# Patient Record
Sex: Female | Born: 1985 | Race: White | Hispanic: No | Marital: Married | Smoking: Never smoker
Health system: Southern US, Community
[De-identification: ages and names within clinical notes are randomized; demographics above are authoritative.]

## PROBLEM LIST (undated history)

## (undated) DIAGNOSIS — R519 Headache, unspecified: Secondary | ICD-10-CM

## (undated) DIAGNOSIS — Z9889 Other specified postprocedural states: Secondary | ICD-10-CM

## (undated) DIAGNOSIS — R112 Nausea with vomiting, unspecified: Secondary | ICD-10-CM

## (undated) DIAGNOSIS — R51 Headache: Secondary | ICD-10-CM

## (undated) DIAGNOSIS — O24419 Gestational diabetes mellitus in pregnancy, unspecified control: Secondary | ICD-10-CM

## (undated) HISTORY — PX: ARTHROSCOPIC REPAIR ACL: SUR80

## (undated) HISTORY — DX: Other specified postprocedural states: R11.2

## (undated) HISTORY — DX: Headache, unspecified: R51.9

## (undated) HISTORY — DX: Gestational diabetes mellitus in pregnancy, unspecified control: O24.419

## (undated) HISTORY — DX: Nausea with vomiting, unspecified: R11.2

## (undated) HISTORY — DX: Other specified postprocedural states: Z98.890

## (undated) HISTORY — DX: Headache: R51

## (undated) HISTORY — PX: TUMOR REMOVAL: SHX12

---

## 2007-04-26 ENCOUNTER — Emergency Department (HOSPITAL_COMMUNITY): Admission: EM | Admit: 2007-04-26 | Discharge: 2007-04-26 | Payer: Self-pay | Admitting: Emergency Medicine

## 2007-11-26 ENCOUNTER — Emergency Department (HOSPITAL_COMMUNITY): Admission: EM | Admit: 2007-11-26 | Discharge: 2007-11-26 | Payer: Self-pay | Admitting: Emergency Medicine

## 2009-04-26 IMAGING — CT CT HEAD W/O CM
1 series · 16 of 30 positions shown, 20 images · non-contrast
Comparison: None

CLINICAL DATA: Alcohol poisoning and vomiting

CT HEAD WITHOUT CONTRAST
TECHNIQUE: Contiguous axial images were obtained from the base of
the skull through the vertex without contrast.

[Series 2: headseq 4.8 h45s · axial · 0.43mm/px · z∈[-148,-20]mm · 16 of 30 slices shown, 20 images]
[im 2/30  brain]
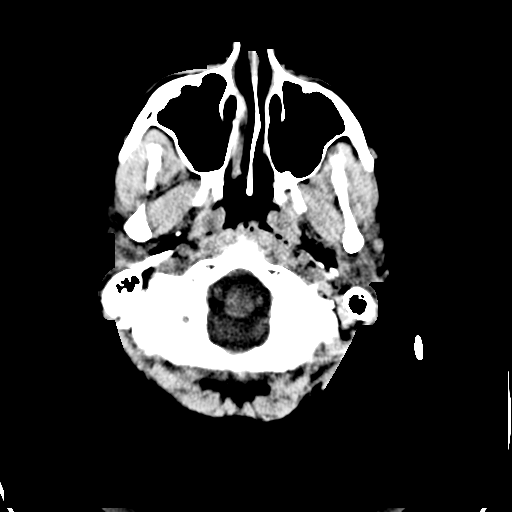
[im 2/30  bone]
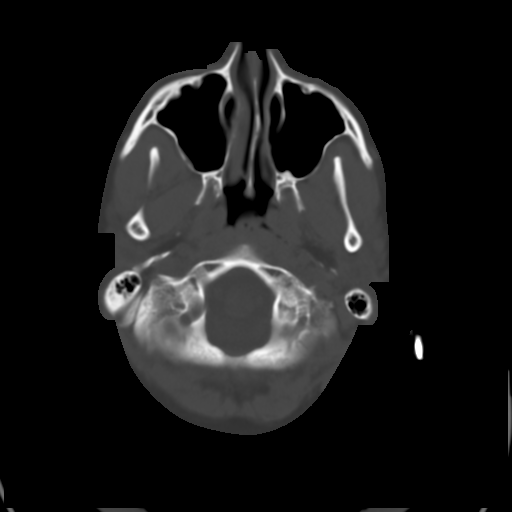
[im 4/30  brain]
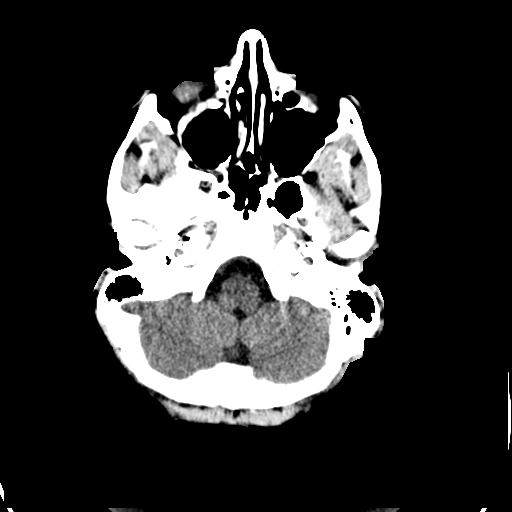
[im 6/30  brain]
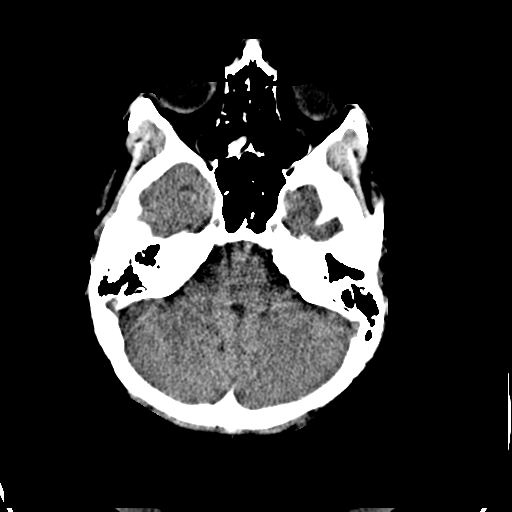
[im 8/30  brain]
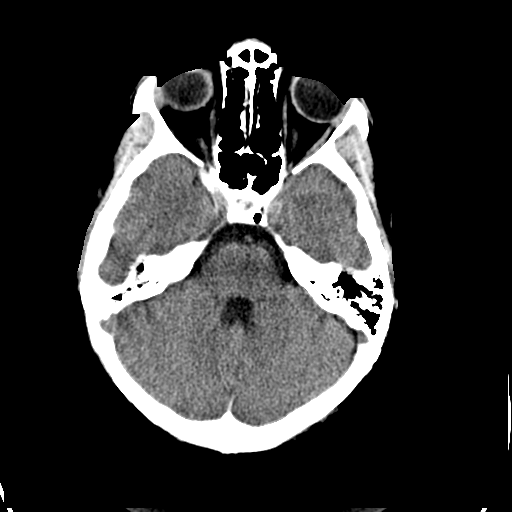
[im 9/30  brain]
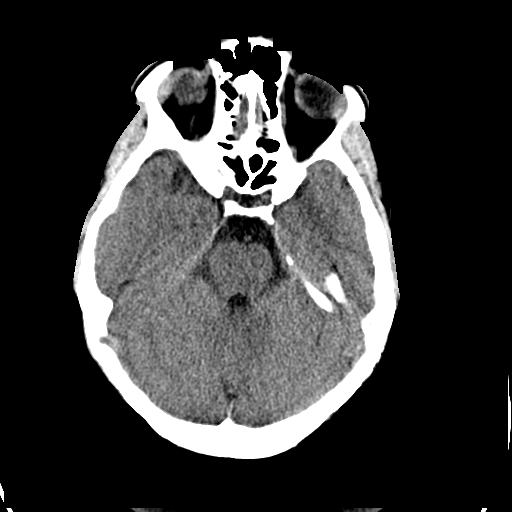
[im 9/30  bone]
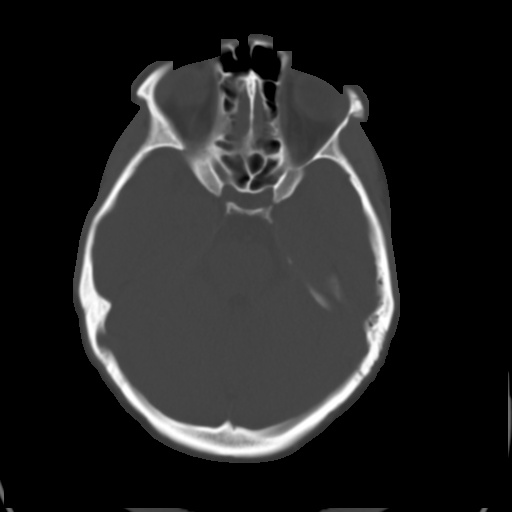
[im 11/30  brain]
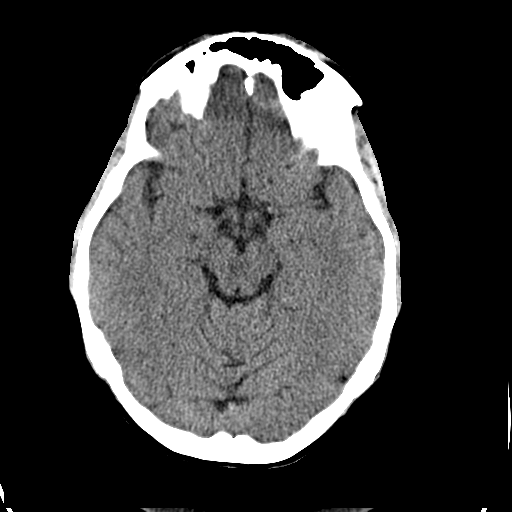
[im 13/30  brain]
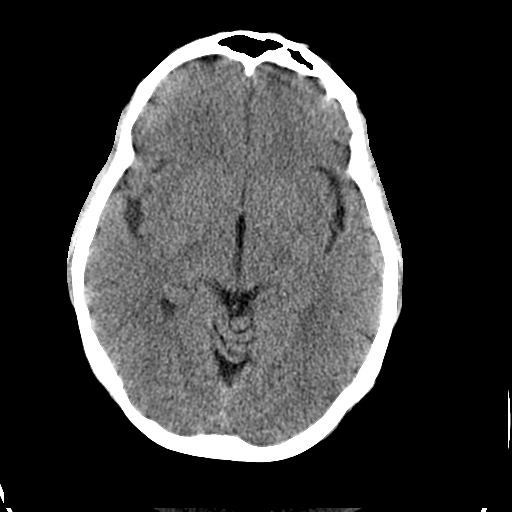
[im 15/30  brain]
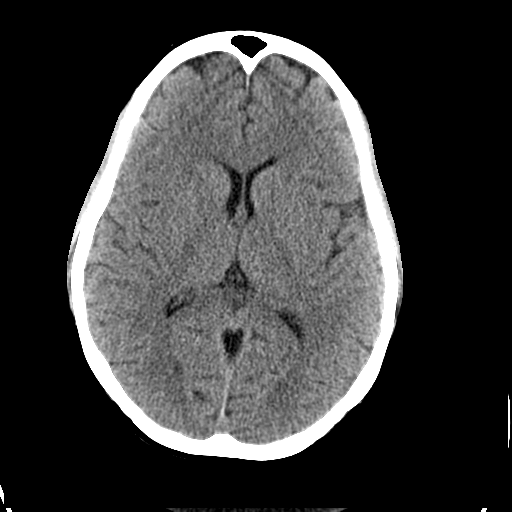
[im 16/30  brain]
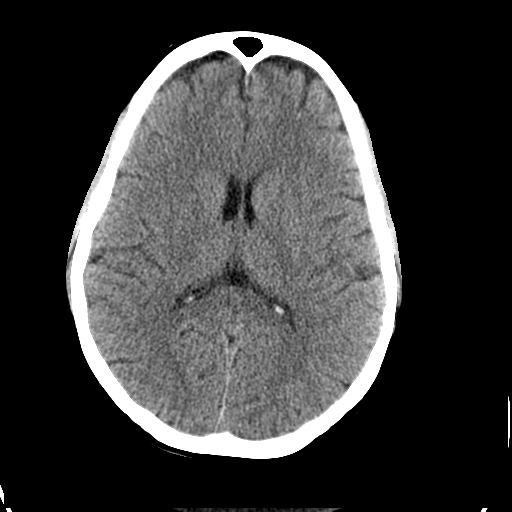
[im 16/30  bone]
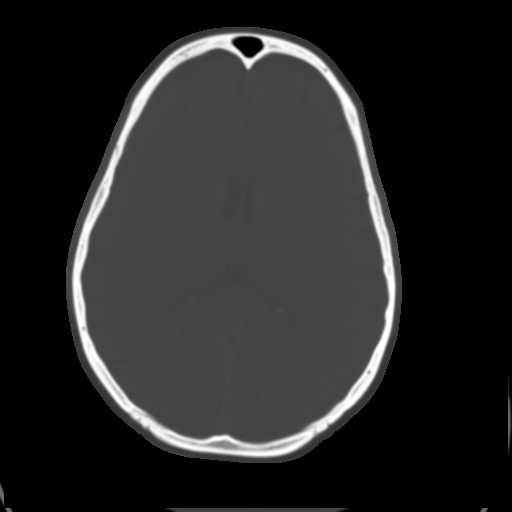
[im 18/30  brain]
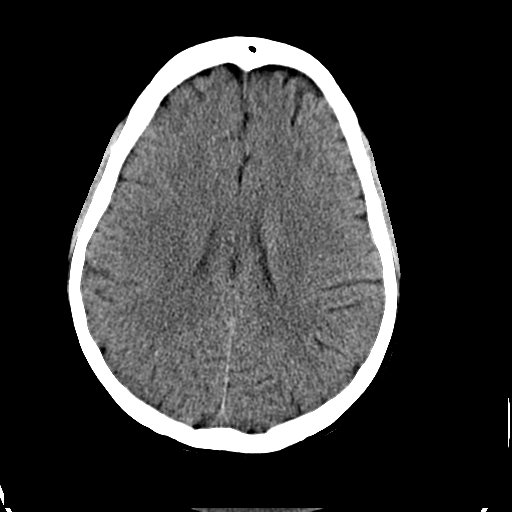
[im 20/30  brain]
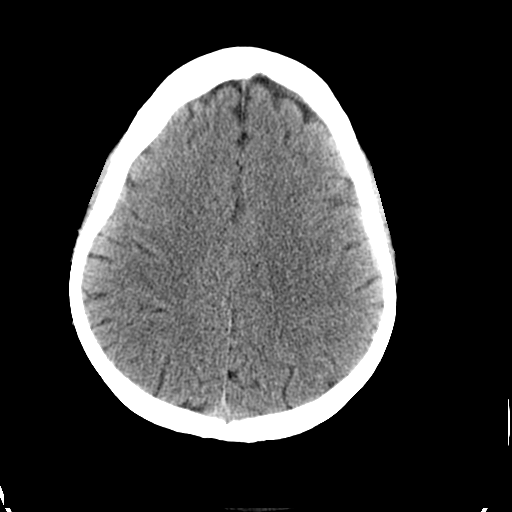
[im 22/30  brain]
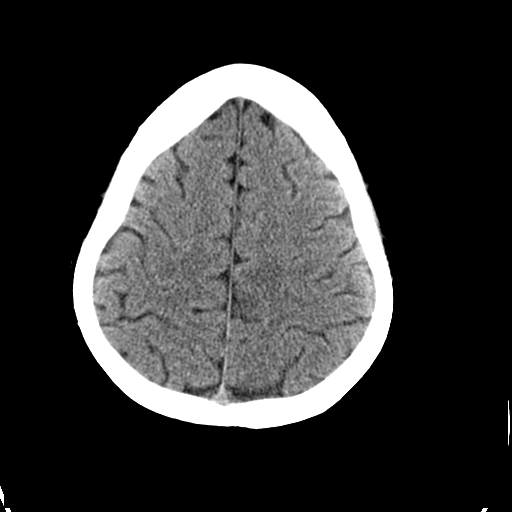
[im 23/30  brain]
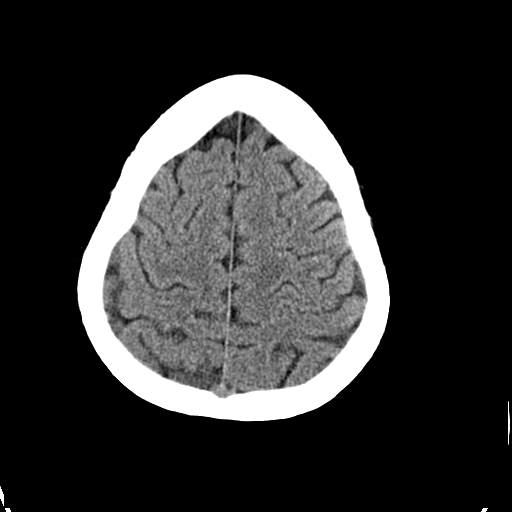
[im 23/30  bone]
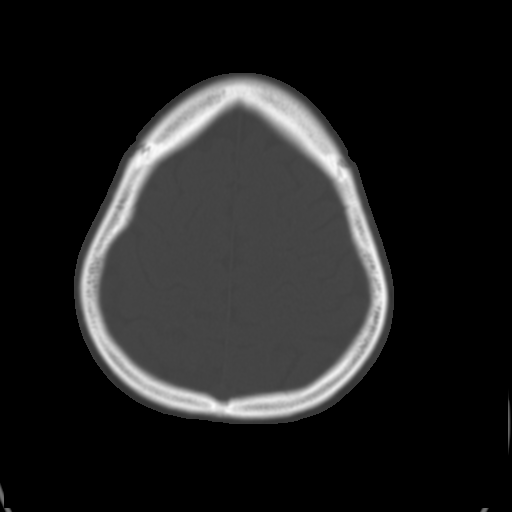
[im 25/30  brain]
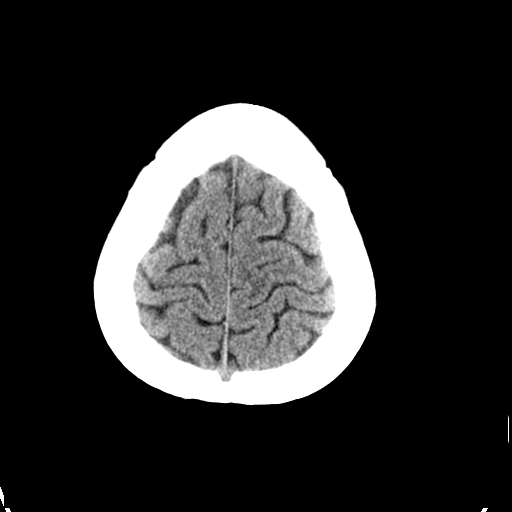
[im 27/30  brain]
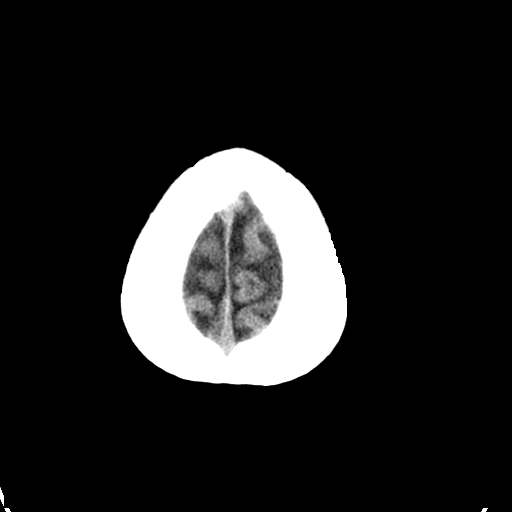
[im 29/30  brain]
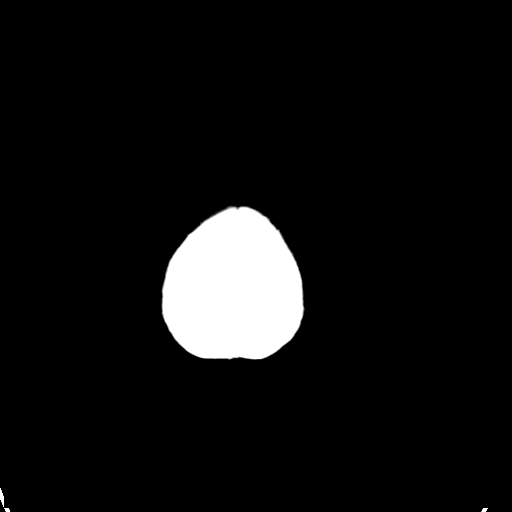

[16 of 30 positions shown; findings below may reference images not displayed]

FINDINGS: There is no mass effect, midline shift, or acute
intracranial hemorrhage.  The ventricular system and extraaxial
space are within normal limits.  The mastoid air cells clear.
Minimal mucosal thickening in the right maxillary sinus has a
chronic appearance.
IMPRESSION: No acute intracranial pathology.

## 2014-07-10 ENCOUNTER — Telehealth: Payer: Self-pay | Admitting: Internal Medicine

## 2014-07-10 MED ORDER — ONDANSETRON HCL 4 MG PO TABS: 4.0000 mg | ORAL_TABLET | Freq: Three times a day (TID) | ORAL | Status: DC | PRN

## 2014-07-10 NOTE — Telephone Encounter (Signed)
Patient is coming soon to establish as a new patient. 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 Over the weekend developed nausea vomiting and diarrhea. Had multiple episodes of vomiting and diarrhea. Is beginning to feel better. He stayed with clear liquids. However is quite nauseated. Low-grade fever of maybe 100. Call in Zofran tablets to [facility] for her. If she is not continuing to improve over the next few days, we will see her and she knows to call back if she's not better.

## 2014-07-17 ENCOUNTER — Other Ambulatory Visit: Payer: PRIVATE HEALTH INSURANCE | Admitting: Internal Medicine

## 2014-07-17 DIAGNOSIS — Z1321 Encounter for screening for nutritional disorder: Secondary | ICD-10-CM

## 2014-07-17 DIAGNOSIS — Z1329 Encounter for screening for other suspected endocrine disorder: Secondary | ICD-10-CM

## 2014-07-17 DIAGNOSIS — Z13220 Encounter for screening for lipoid disorders: Secondary | ICD-10-CM

## 2014-07-17 DIAGNOSIS — Z0000 Encounter for general adult medical examination without abnormal findings: Secondary | ICD-10-CM

## 2014-07-17 DIAGNOSIS — Z130 Encounter for screening for diseases of the blood and blood-forming organs and certain disorders involving the immune mechanism: Secondary | ICD-10-CM

## 2014-07-17 LAB — COMPREHENSIVE METABOLIC PANEL
ALT: 23 U/L (ref 0–35)
AST: 19 U/L (ref 0–37)
Albumin: 4.3 g/dL (ref 3.5–5.2)
Alkaline Phosphatase: 41 U/L (ref 39–117)
BUN: 12 mg/dL (ref 6–23)
CALCIUM: 9.2 mg/dL (ref 8.4–10.5)
CHLORIDE: 104 meq/L (ref 96–112)
CO2: 25 meq/L (ref 19–32)
Creat: 0.69 mg/dL (ref 0.50–1.10)
Glucose, Bld: 85 mg/dL (ref 70–99)
POTASSIUM: 4.5 meq/L (ref 3.5–5.3)
SODIUM: 138 meq/L (ref 135–145)
TOTAL PROTEIN: 6.4 g/dL (ref 6.0–8.3)
Total Bilirubin: 0.4 mg/dL (ref 0.2–1.2)

## 2014-07-17 LAB — LIPID PANEL
Cholesterol: 144 mg/dL (ref 0–200)
HDL: 45 mg/dL (ref >39)
LDL Cholesterol: 86 mg/dL (ref 0–99)
Total CHOL/HDL Ratio: 3.2 Ratio
Triglycerides: 63 mg/dL (ref <150)
VLDL: 13 mg/dL (ref 0–40)

## 2014-07-17 LAB — CBC WITH DIFFERENTIAL/PLATELET
BASOS PCT: 0 % (ref 0–1)
Basophils Absolute: 0 10*3/uL (ref 0.0–0.1)
EOS ABS: 0.1 10*3/uL (ref 0.0–0.7)
Eosinophils Relative: 1 % (ref 0–5)
HCT: 37.1 % (ref 36.0–46.0)
HEMOGLOBIN: 12.3 g/dL (ref 12.0–15.0)
Lymphocytes Relative: 42 % (ref 12–46)
Lymphs Abs: 3.3 10*3/uL (ref 0.7–4.0)
MCH: 29.4 pg (ref 26.0–34.0)
MCHC: 33.2 g/dL (ref 30.0–36.0)
MCV: 88.5 fL (ref 78.0–100.0)
MPV: 9.3 fL — AB (ref 9.4–12.4)
Monocytes Absolute: 0.7 10*3/uL (ref 0.1–1.0)
Monocytes Relative: 9 % (ref 3–12)
NEUTROS PCT: 48 % (ref 43–77)
Neutro Abs: 3.8 10*3/uL (ref 1.7–7.7)
Platelets: 367 10*3/uL (ref 150–400)
RBC: 4.19 MIL/uL (ref 3.87–5.11)
RDW: 13.4 % (ref 11.5–15.5)
WBC: 7.9 10*3/uL (ref 4.0–10.5)

## 2014-07-18 LAB — VITAMIN D 25 HYDROXY (VIT D DEFICIENCY, FRACTURES): VIT D 25 HYDROXY: 21 ng/mL — AB (ref 30–100)

## 2014-07-18 LAB — TSH: TSH: 1.801 u[IU]/mL (ref 0.350–4.500)

## 2014-07-23 ENCOUNTER — Encounter: Payer: Self-pay | Admitting: Internal Medicine

## 2014-07-23 ENCOUNTER — Ambulatory Visit (INDEPENDENT_AMBULATORY_CARE_PROVIDER_SITE_OTHER): Payer: PRIVATE HEALTH INSURANCE | Admitting: Internal Medicine

## 2014-07-23 VITALS — BP 120/78 | HR 78 | Temp 98.3°F | Wt 159.0 lb

## 2014-07-23 DIAGNOSIS — R87610 Atypical squamous cells of undetermined significance on cytologic smear of cervix (ASC-US): Secondary | ICD-10-CM

## 2014-07-23 DIAGNOSIS — E559 Vitamin D deficiency, unspecified: Secondary | ICD-10-CM

## 2014-07-23 DIAGNOSIS — Z8669 Personal history of other diseases of the nervous system and sense organs: Secondary | ICD-10-CM

## 2014-07-23 NOTE — Patient Instructions (Signed)
Return in 6 months to have repeat vitamin D level. Take 2000 units vitamin D 3 daily. Also have repeat Pap smear for ASCUS at that time.

## 2014-08-05 NOTE — Progress Notes (Signed)
Subjective:    Patient ID: Julie Contreras, female    DOB: 06/22/1986, 29 y.o.   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 General health is good.  Patient had Pap smear done September 2015 at [place] for women showing ASCUS. Suggested patient have repeat Pap in 6 months. She has a Mirena IUD.  Was seen in the emergency department December 2008 with chest pain. Apparently had chest wall pain.  Had pelvic ultrasound and [place] in January 2009 pelvic pain. Had thickened endometrial stripe. Ovaries were normal.  Had EKG 07/25/2007 with being evaluated for chest pain showing PACs and sinus bradycardia with rate of 50.  Some remote history of leukopenia.  In 2008 had a white blood cell count of 2500 with normal hemoglobin and platelet count. Had flulike illness in 2008. Normal Pap smear in [facility] by Dr. Florene Glen July 2008.  History of asthma. History of migraine headaches.  Is intolerant of certain types of anesthesia.  No known drug allergies.  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  Fractured right leg at growth plate at age 10  Had nursemaid's elbow at age 29  Family history: Father living age 29 with history of diabetes. Mother living age 29 with history of melanoma. One brother in good health.  Social history: Single. JD degree from [employer]. 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 Does not smoke. Social alcohol consumption consisting of wine 4 ounces daily. Patient exercises 4 days a week with the elliptical or spinning class.  Additional history: Diagnosed with leukopenia at age 29    Review of Systems  Constitutional: Negative.   HENT: Negative.   Respiratory: Negative.   Cardiovascular: Negative.   Gastrointestinal: Negative.   Endocrine: Negative.   Neurological:        History of migraine headaches  Psychiatric/Behavioral: Negative.        Objective:   Physical Exam  Constitutional: She is oriented to person, place, and time. She appears well-developed and well-nourished. No distress.  HENT:  Head: Normocephalic and atraumatic.  Right Ear: External ear normal.  Mouth/Throat: Oropharynx is clear and moist. No oropharyngeal exudate.  Eyes: Conjunctivae are normal. Right eye exhibits no discharge. Left eye exhibits no discharge.  Neck: Neck supple. No JVD present. No thyromegaly present.  Cardiovascular: Normal rate, regular rhythm and normal heart sounds.   No murmur heard. Pulmonary/Chest: Effort normal and breath sounds normal. She has no wheezes.  Abdominal: Soft. Bowel sounds are normal. She exhibits no distension and no mass. There is no rebound.  Genitourinary:  Deferred. Recommend repeat Pap in 6 months  Musculoskeletal: Normal range of motion. She exhibits no edema.  Lymphadenopathy:    She has no cervical adenopathy.  Neurological: She is alert and oriented to person, place, and time. She has normal reflexes. No cranial nerve deficit.  Skin: Skin is warm and dry. No rash noted. She is not diaphoretic.  Psychiatric: She has a normal mood and affect. Her behavior is normal. Judgment and thought content normal.  Vitals reviewed.         Assessment & Plan:  Normal health maintenance exam  History of migraine headaches  History of asthma  History of ASCUS on Pap smear  September 2015  Vitamin D deficiency-take 2000 units vitamin D 3 daily and return in 6 months for  level.  Plan: Return in 6 months for repeat Pap and pelvic exam. Lab work is within normal limits.

## 2014-08-07 DIAGNOSIS — Z8669 Personal history of other diseases of the nervous system and sense organs: Secondary | ICD-10-CM | POA: Insufficient documentation

## 2015-04-08 ENCOUNTER — Other Ambulatory Visit: Payer: Self-pay | Admitting: Obstetrics & Gynecology

## 2015-04-08 DIAGNOSIS — N6459 Other signs and symptoms in breast: Secondary | ICD-10-CM

## 2015-04-08 DIAGNOSIS — N644 Mastodynia: Secondary | ICD-10-CM

## 2015-04-11 ENCOUNTER — Ambulatory Visit
Admission: RE | Admit: 2015-04-11 | Discharge: 2015-04-11 | Disposition: A | Discharge status: Home / Self Care | Payer: PRIVATE HEALTH INSURANCE | Source: Ambulatory Visit | Attending: Obstetrics & Gynecology | Admitting: Obstetrics & Gynecology

## 2015-04-11 DIAGNOSIS — N6459 Other signs and symptoms in breast: Secondary | ICD-10-CM

## 2015-04-11 DIAGNOSIS — N644 Mastodynia: Secondary | ICD-10-CM

## 2015-06-18 ENCOUNTER — Ambulatory Visit (INDEPENDENT_AMBULATORY_CARE_PROVIDER_SITE_OTHER): Payer: PRIVATE HEALTH INSURANCE | Admitting: Internal Medicine

## 2015-06-18 ENCOUNTER — Encounter: Payer: Self-pay | Admitting: Internal Medicine

## 2015-06-18 VITALS — BP 118/84 | HR 68 | Temp 97.8°F | Resp 20 | Wt 157.0 lb

## 2015-06-18 DIAGNOSIS — J029 Acute pharyngitis, unspecified: Secondary | ICD-10-CM | POA: Diagnosis not present

## 2015-06-18 DIAGNOSIS — J0100 Acute maxillary sinusitis, unspecified: Secondary | ICD-10-CM | POA: Diagnosis not present

## 2015-06-18 DIAGNOSIS — G4489 Other headache syndrome: Secondary | ICD-10-CM | POA: Diagnosis not present

## 2015-06-18 MED ORDER — BENZONATATE 100 MG PO CAPS: 200.0000 mg | ORAL_CAPSULE | Freq: Three times a day (TID) | ORAL | Status: DC

## 2015-06-18 MED ORDER — ALBUTEROL SULFATE HFA 108 (90 BASE) MCG/ACT IN AERS: 2.0000 | INHALATION_SPRAY | Freq: Four times a day (QID) | RESPIRATORY_TRACT | Status: DC | PRN

## 2015-06-18 MED ORDER — AZITHROMYCIN 250 MG PO TABS: ORAL_TABLET | ORAL | Status: DC

## 2015-06-18 MED ORDER — HYDROCODONE-ACETAMINOPHEN 5-325 MG PO TABS: 1.0000 | ORAL_TABLET | Freq: Four times a day (QID) | ORAL | Status: DC | PRN

## 2015-06-18 MED ORDER — METHYLPREDNISOLONE ACETATE 80 MG/ML IJ SUSP
80.0000 mg | Freq: Once | INTRAMUSCULAR | Status: AC
  Administered 2015-06-18: 80 mg via INTRAMUSCULAR

## 2015-06-18 NOTE — Progress Notes (Signed)
   Subjective:    Patient ID: Julie Contreras, female    DOB: 1986/04/10, 29 y.o.   MRN: [id number]  HPI  Onset headache and chest tightness 2 weeks ago. Hx childhood asthma. Started with trip to [place] hiking in smoky conditions. Has felt like she needed inhaler at times. No fever or chills. Slight sore throat.    Review of Systems see above     Objective:   Physical Exam  Constitutional: She is oriented to person, place, and time.  HENT:  Head: Normocephalic and atraumatic.  Right Ear: External ear normal.  Left Ear: External ear normal.  Mouth/Throat: Oropharynx is clear and moist. No oropharyngeal exudate.  Eyes: Conjunctivae are normal. Pupils are equal, round, and reactive to light. Right eye exhibits no discharge. Left eye exhibits no discharge.  Neck: Neck supple.  Pulmonary/Chest: Effort normal and breath sounds normal.  Occasional wheezing bilaterally. No rales.  Lymphadenopathy:    She has no cervical adenopathy.  Neurological: She is alert and oriented to person, place, and time. No cranial nerve deficit. Coordination normal.  Skin: Skin is warm and dry. No rash noted.          Assessment & Plan:  Acute bronchitis  Bronchospasm-history of childhood asthma. Likely has bronchospasm induced by smoky conditions in acute respiratory infection  Headache-not responding to over-the-counter medication  Plan: Ventolin inhaler 2 sprays 4 times daily as needed for shortness of breath/wheezing. Zithromax Z-PAK- take 2 tablets day one followed by 1 tablet days 2 through 5. Tessalon Perles 200 mg 3 times daily as needed for cough. Hydrocodone/APAP 5/325 one by mouth every 6-8 hours when necessary headache. Depo-Medrol 80 mg IM which should help with headache as well as shortness of breath/bronchospasm. Call if not better in 7 days or sooner if worse.

## 2015-06-26 ENCOUNTER — Encounter: Payer: Self-pay | Admitting: Internal Medicine

## 2015-06-26 NOTE — Patient Instructions (Addendum)
Depo-Medrol 80 mg IM to help with wheezing and shortness of breath as well as headache. Hydrocodone/APAP 1 by mouth every 6-8 hours when necessary headache. Tessalon Perles as needed for cough. Ventolin inhaler 2 sprays 4 times daily as needed for cough and wheezing. Take Zithromax Z-PAK as directed. Call if not better in 7 days or sooner if worse.

## 2017-01-04 ENCOUNTER — Encounter: Payer: Self-pay | Admitting: Internal Medicine

## 2017-01-04 ENCOUNTER — Ambulatory Visit (INDEPENDENT_AMBULATORY_CARE_PROVIDER_SITE_OTHER): Payer: 59 | Admitting: Internal Medicine

## 2017-01-04 VITALS — BP 120/64 | HR 66 | Temp 98.3°F | Wt 167.0 lb

## 2017-01-04 DIAGNOSIS — J0100 Acute maxillary sinusitis, unspecified: Secondary | ICD-10-CM | POA: Diagnosis not present

## 2017-01-04 DIAGNOSIS — J069 Acute upper respiratory infection, unspecified: Secondary | ICD-10-CM

## 2017-01-04 DIAGNOSIS — J22 Unspecified acute lower respiratory infection: Secondary | ICD-10-CM

## 2017-01-04 MED ORDER — ALBUTEROL SULFATE HFA 108 (90 BASE) MCG/ACT IN AERS: 2.0000 | INHALATION_SPRAY | Freq: Four times a day (QID) | RESPIRATORY_TRACT | 0 refills | Status: DC | PRN

## 2017-01-04 MED ORDER — LEVOFLOXACIN 500 MG PO TABS: 500.0000 mg | ORAL_TABLET | Freq: Every day | ORAL | 0 refills | Status: DC

## 2017-01-04 MED ORDER — BENZONATATE 100 MG PO CAPS: 200.0000 mg | ORAL_CAPSULE | Freq: Three times a day (TID) | ORAL | 1 refills | Status: DC

## 2017-01-04 MED ORDER — METHYLPREDNISOLONE ACETATE 80 MG/ML IJ SUSP
80.0000 mg | Freq: Once | INTRAMUSCULAR | Status: AC
  Administered 2017-01-04: 80 mg via INTRAMUSCULAR

## 2017-01-04 MED ORDER — FLUCONAZOLE 150 MG PO TABS
150.0000 mg | ORAL_TABLET | Freq: Once | ORAL | 1 refills | Status: AC
Start: 2017-01-04 — End: 2017-01-04

## 2017-01-04 NOTE — Patient Instructions (Signed)
Levaquin 500 milligrams daily for 10 days. Take with a meal. Albuterol inhaler refill. Tessalon Perles 100 mg 3 times daily as needed for cough. Rest and drink plenty of fluids. Depo-Medrol 80 mg IM.

## 2017-01-04 NOTE — Progress Notes (Signed)
   Subjective:    Patient ID: Julie Contreras, female    DOB: 1986/05/03, 31 y.o.   MRN: [id number]  HPI  31 year old Female for protracted URI symptoms for 2-3 weeks and now has developed cough with discolored sputum. Things seem to get worse after trip out [place] to [facility]. May have had low grade fever recently. Sounds nasally congested when she speaks. Has had some wheezing. Has been using leftover albuterol inhaler. Has been taking some Tessalon Perles that were left over.  Has IUD in place    Review of Systems see above     Objective:   Physical Exam Skin warm and dry. Nodes none. Pharynx is slightly injected. Left TM slightly full but not red. Right TM dull but not full. Neck is supple. Chest clear to auscultation without rales or wheezing.       Assessment & Plan:  Acute sinusitis  Acute bronchitis  Plan: Levaquin 500 milligrams daily for 10 days. Refill Ventolin inhaler 2 sprays by mouth 4 times a day as needed for cough and wheezing. Tessalon Perles refilled 100 mg 3 times daily as needed for cough. Depo-Medrol 80 mg IM. Rest and drink plenty of fluids.

## 2017-06-07 LAB — OB RESULTS CONSOLE GC/CHLAMYDIA
Chlamydia: NEGATIVE
GC PROBE AMP, GENITAL: NEGATIVE

## 2017-06-07 LAB — OB RESULTS CONSOLE RUBELLA ANTIBODY, IGM: Rubella: IMMUNE

## 2017-06-07 LAB — OB RESULTS CONSOLE ABO/RH: RH Type: POSITIVE

## 2017-06-07 LAB — OB RESULTS CONSOLE HEPATITIS B SURFACE ANTIGEN: Hepatitis B Surface Ag: NEGATIVE

## 2017-06-07 LAB — OB RESULTS CONSOLE ANTIBODY SCREEN: Antibody Screen: NEGATIVE

## 2017-06-07 LAB — OB RESULTS CONSOLE RPR: RPR: NONREACTIVE

## 2017-06-07 LAB — OB RESULTS CONSOLE HIV ANTIBODY (ROUTINE TESTING): HIV: NONREACTIVE

## 2017-07-27 NOTE — L&D Delivery Note (Signed)
Delivery Note At 1:52 PM a viable female was delivered via Vaginal, Spontaneous (Presentation: ROA).  APGAR: 9, 9; weight pending.   Placenta status: S, I. 3V Cord with the following complications: none.  Cord pH: n/a  Anesthesia:  CLEA Episiotomy: None Lacerations: 2nd degree;Perineal;Periurethral Suture Repair: 3.0 vicryl rapide Est. Blood Loss (mL): 250  Mom to postpartum.  Baby to Couplet care / Skin to Skin.  Malissie Musgrave, [place] 01/09/2018, 2:28 PM

## 2017-11-10 ENCOUNTER — Encounter: Payer: 59 | Attending: Obstetrics & Gynecology | Admitting: Registered"

## 2017-11-10 DIAGNOSIS — Z713 Dietary counseling and surveillance: Secondary | ICD-10-CM | POA: Insufficient documentation

## 2017-11-10 DIAGNOSIS — R7309 Other abnormal glucose: Secondary | ICD-10-CM | POA: Insufficient documentation

## 2017-11-10 DIAGNOSIS — O99810 Abnormal glucose complicating pregnancy: Secondary | ICD-10-CM

## 2017-11-12 ENCOUNTER — Encounter: Payer: Self-pay | Admitting: Registered"

## 2017-11-12 DIAGNOSIS — O99810 Abnormal glucose complicating pregnancy: Secondary | ICD-10-CM | POA: Insufficient documentation

## 2017-11-12 NOTE — Progress Notes (Signed)
Patient was seen on 11/10/2017 for Gestational Diabetes self-management class at [facility]. The following learning objectives were met by the patient during this course:   States the definition of Gestational Diabetes  States why dietary management is important in controlling blood glucose  Describes the effects each nutrient has on blood glucose levels  Demonstrates ability to create a balanced meal plan  Demonstrates carbohydrate counting   States when to check blood glucose levels  Demonstrates proper blood glucose monitoring techniques  States the effect of stress and exercise on blood glucose levels  States the importance of limiting caffeine and abstaining from alcohol and smoking  Blood glucose monitor given: [employer] Lot # [id number] X Exp: 10/25/2018 Blood glucose reading: 78  Patient instructed to monitor glucose levels: FBS: 60 - <95; 1 hour: <140; 2 hour: <120  Patient received handouts:  Nutrition Diabetes and Pregnancy, including carb counting list  Patient will be seen for follow-up as needed.

## 2018-01-04 ENCOUNTER — Telehealth (HOSPITAL_COMMUNITY): Payer: Self-pay | Admitting: *Deleted

## 2018-01-04 ENCOUNTER — Encounter (HOSPITAL_COMMUNITY): Payer: Self-pay | Admitting: *Deleted

## 2018-01-04 LAB — OB RESULTS CONSOLE GBS: GBS: NEGATIVE

## 2018-01-04 NOTE — Telephone Encounter (Signed)
Preadmission screen  

## 2018-01-05 ENCOUNTER — Telehealth (HOSPITAL_COMMUNITY): Payer: Self-pay | Admitting: *Deleted

## 2018-01-05 ENCOUNTER — Encounter (HOSPITAL_COMMUNITY): Payer: Self-pay | Admitting: *Deleted

## 2018-01-05 NOTE — Telephone Encounter (Signed)
Preadmission screen  

## 2018-01-09 ENCOUNTER — Other Ambulatory Visit: Payer: Self-pay

## 2018-01-09 ENCOUNTER — Inpatient Hospital Stay (HOSPITAL_COMMUNITY): Payer: 59 | Admitting: Anesthesiology

## 2018-01-09 ENCOUNTER — Encounter (HOSPITAL_COMMUNITY): Payer: Self-pay | Admitting: *Deleted

## 2018-01-09 ENCOUNTER — Inpatient Hospital Stay (HOSPITAL_COMMUNITY)
Admission: AD | Admit: 2018-01-09 | Discharge: 2018-01-11 | DRG: 807 | Disposition: A | Discharge status: Home / Self Care | Payer: 59 | Attending: Obstetrics & Gynecology | Admitting: Obstetrics & Gynecology

## 2018-01-09 DIAGNOSIS — O24420 Gestational diabetes mellitus in childbirth, diet controlled: Secondary | ICD-10-CM | POA: Diagnosis present

## 2018-01-09 DIAGNOSIS — O9962 Diseases of the digestive system complicating childbirth: Secondary | ICD-10-CM | POA: Diagnosis present

## 2018-01-09 DIAGNOSIS — O165 Unspecified maternal hypertension, complicating the puerperium: Secondary | ICD-10-CM | POA: Diagnosis present

## 2018-01-09 DIAGNOSIS — Z3A39 39 weeks gestation of pregnancy: Secondary | ICD-10-CM

## 2018-01-09 DIAGNOSIS — K219 Gastro-esophageal reflux disease without esophagitis: Secondary | ICD-10-CM | POA: Diagnosis present

## 2018-01-09 DIAGNOSIS — O99214 Obesity complicating childbirth: Secondary | ICD-10-CM | POA: Diagnosis present

## 2018-01-09 DIAGNOSIS — Z3490 Encounter for supervision of normal pregnancy, unspecified, unspecified trimester: Secondary | ICD-10-CM

## 2018-01-09 LAB — COMPREHENSIVE METABOLIC PANEL
ALBUMIN: 2.8 g/dL — AB (ref 3.5–5.0)
ALK PHOS: 155 U/L — AB (ref 38–126)
ALT: 14 U/L (ref 14–54)
AST: 21 U/L (ref 15–41)
Anion gap: 11 (ref 5–15)
BILIRUBIN TOTAL: 0.6 mg/dL (ref 0.3–1.2)
BUN: 10 mg/dL (ref 6–20)
CALCIUM: 9.2 mg/dL (ref 8.9–10.3)
CO2: 18 mmol/L — ABNORMAL LOW (ref 22–32)
Chloride: 107 mmol/L (ref 101–111)
Creatinine, Ser: 0.53 mg/dL (ref 0.44–1.00)
GFR calc Af Amer: >60 mL/min (ref >60)
GLUCOSE: 87 mg/dL (ref 65–99)
POTASSIUM: 4 mmol/L (ref 3.5–5.1)
Sodium: 136 mmol/L (ref 135–145)
TOTAL PROTEIN: 5.8 g/dL — AB (ref 6.5–8.1)

## 2018-01-09 LAB — CBC
HEMATOCRIT: 32 % — AB (ref 36.0–46.0)
Hemoglobin: 11 g/dL — ABNORMAL LOW (ref 12.0–15.0)
MCH: 30.1 pg (ref 26.0–34.0)
MCHC: 34.4 g/dL (ref 30.0–36.0)
MCV: 87.4 fL (ref 78.0–100.0)
PLATELETS: 149 10*3/uL — AB (ref 150–400)
RBC: 3.66 MIL/uL — ABNORMAL LOW (ref 3.87–5.11)
RDW: 13.4 % (ref 11.5–15.5)
WBC: 7.8 10*3/uL (ref 4.0–10.5)

## 2018-01-09 LAB — URINALYSIS, ROUTINE W REFLEX MICROSCOPIC
Bilirubin Urine: NEGATIVE
GLUCOSE, UA: NEGATIVE mg/dL
KETONES UR: NEGATIVE mg/dL
LEUKOCYTES UA: NEGATIVE
Nitrite: NEGATIVE
PH: 8 (ref 5.0–8.0)
PROTEIN: NEGATIVE mg/dL
Specific Gravity, Urine: 1.004 — ABNORMAL LOW (ref 1.005–1.030)

## 2018-01-09 LAB — ABO/RH: ABO/RH(D): B POS

## 2018-01-09 LAB — TYPE AND SCREEN
ABO/RH(D): B POS
ANTIBODY SCREEN: NEGATIVE

## 2018-01-09 LAB — POCT FERN TEST: POCT Fern Test: POSITIVE

## 2018-01-09 LAB — RPR: RPR Ser Ql: NONREACTIVE

## 2018-01-09 LAB — GLUCOSE, CAPILLARY: GLUCOSE-CAPILLARY: 78 mg/dL (ref 65–99)

## 2018-01-09 MED ORDER — EPHEDRINE 5 MG/ML INJ
10.0000 mg | INTRAVENOUS | Status: DC | PRN
  Filled 2018-01-09: qty 2

## 2018-01-09 MED ORDER — ONDANSETRON HCL 4 MG/2ML IJ SOLN: 4.0000 mg | INTRAMUSCULAR | Status: DC | PRN

## 2018-01-09 MED ORDER — PHENYLEPHRINE 40 MCG/ML (10ML) SYRINGE FOR IV PUSH (FOR BLOOD PRESSURE SUPPORT)
PREFILLED_SYRINGE | INTRAVENOUS | Status: AC
  Filled 2018-01-09: qty 20

## 2018-01-09 MED ORDER — FENTANYL 2.5 MCG/ML BUPIVACAINE 1/10 % EPIDURAL INFUSION (WH - ANES)
INTRAMUSCULAR | Status: AC
  Filled 2018-01-09: qty 100

## 2018-01-09 MED ORDER — TERBUTALINE SULFATE 1 MG/ML IJ SOLN
0.2500 mg | Freq: Once | INTRAMUSCULAR | Status: DC | PRN
  Filled 2018-01-09: qty 1

## 2018-01-09 MED ORDER — SOD CITRATE-CITRIC ACID 500-334 MG/5ML PO SOLN: 30.0000 mL | ORAL | Status: DC | PRN

## 2018-01-09 MED ORDER — LACTATED RINGERS IV SOLN: 500.0000 mL | Freq: Once | INTRAVENOUS | Status: DC

## 2018-01-09 MED ORDER — PHENYLEPHRINE 40 MCG/ML (10ML) SYRINGE FOR IV PUSH (FOR BLOOD PRESSURE SUPPORT)
80.0000 ug | PREFILLED_SYRINGE | INTRAVENOUS | Status: DC | PRN
  Filled 2018-01-09: qty 5

## 2018-01-09 MED ORDER — ZOLPIDEM TARTRATE 5 MG PO TABS: 5.0000 mg | ORAL_TABLET | Freq: Every evening | ORAL | Status: DC | PRN

## 2018-01-09 MED ORDER — FLEET ENEMA 7-19 GM/118ML RE ENEM: 1.0000 | ENEMA | RECTAL | Status: DC | PRN

## 2018-01-09 MED ORDER — IBUPROFEN 600 MG PO TABS
600.0000 mg | ORAL_TABLET | Freq: Four times a day (QID) | ORAL | Status: DC
  Administered 2018-01-09 – 2018-01-11 (×7): 600 mg via ORAL
  Filled 2018-01-09 (×7): qty 1

## 2018-01-09 MED ORDER — OXYTOCIN 40 UNITS IN LACTATED RINGERS INFUSION - SIMPLE MED
2.5000 [IU]/h | INTRAVENOUS | Status: DC
  Filled 2018-01-09: qty 1000

## 2018-01-09 MED ORDER — SIMETHICONE 80 MG PO CHEW: 80.0000 mg | CHEWABLE_TABLET | ORAL | Status: DC | PRN

## 2018-01-09 MED ORDER — OXYCODONE-ACETAMINOPHEN 5-325 MG PO TABS
1.0000 | ORAL_TABLET | ORAL | Status: DC | PRN
  Administered 2018-01-09: 1 via ORAL
  Filled 2018-01-09: qty 1

## 2018-01-09 MED ORDER — LACTATED RINGERS IV SOLN: 500.0000 mL | INTRAVENOUS | Status: DC | PRN

## 2018-01-09 MED ORDER — FENTANYL CITRATE (PF) 100 MCG/2ML IJ SOLN: 50.0000 ug | INTRAMUSCULAR | Status: DC | PRN

## 2018-01-09 MED ORDER — DIPHENHYDRAMINE HCL 25 MG PO CAPS: 25.0000 mg | ORAL_CAPSULE | Freq: Four times a day (QID) | ORAL | Status: DC | PRN

## 2018-01-09 MED ORDER — DIBUCAINE 1 % RE OINT: 1.0000 "application " | TOPICAL_OINTMENT | RECTAL | Status: DC | PRN

## 2018-01-09 MED ORDER — ACETAMINOPHEN 325 MG PO TABS
650.0000 mg | ORAL_TABLET | ORAL | Status: DC | PRN
  Administered 2018-01-10: 650 mg via ORAL
  Filled 2018-01-09: qty 2

## 2018-01-09 MED ORDER — OXYCODONE-ACETAMINOPHEN 5-325 MG PO TABS: 2.0000 | ORAL_TABLET | ORAL | Status: DC | PRN

## 2018-01-09 MED ORDER — ALBUTEROL SULFATE (2.5 MG/3ML) 0.083% IN NEBU: 3.0000 mL | INHALATION_SOLUTION | Freq: Four times a day (QID) | RESPIRATORY_TRACT | Status: DC | PRN

## 2018-01-09 MED ORDER — OXYCODONE-ACETAMINOPHEN 5-325 MG PO TABS: 1.0000 | ORAL_TABLET | ORAL | Status: DC | PRN

## 2018-01-09 MED ORDER — PRENATAL MULTIVITAMIN CH
1.0000 | ORAL_TABLET | Freq: Every day | ORAL | Status: DC
  Administered 2018-01-10: 1 via ORAL
  Filled 2018-01-09: qty 1

## 2018-01-09 MED ORDER — ONDANSETRON HCL 4 MG PO TABS: 4.0000 mg | ORAL_TABLET | ORAL | Status: DC | PRN

## 2018-01-09 MED ORDER — TETANUS-DIPHTH-ACELL PERTUSSIS 5-2.5-18.5 LF-MCG/0.5 IM SUSP: 0.5000 mL | Freq: Once | INTRAMUSCULAR | Status: DC

## 2018-01-09 MED ORDER — FENTANYL 2.5 MCG/ML BUPIVACAINE 1/10 % EPIDURAL INFUSION (WH - ANES)
14.0000 mL/h | INTRAMUSCULAR | Status: DC | PRN
  Administered 2018-01-09: 14 mL/h via EPIDURAL

## 2018-01-09 MED ORDER — LIDOCAINE HCL (PF) 1 % IJ SOLN
INTRAMUSCULAR | Status: DC | PRN
  Administered 2018-01-09 (×2): 5 mL via EPIDURAL

## 2018-01-09 MED ORDER — LACTATED RINGERS IV SOLN
INTRAVENOUS | Status: DC
  Administered 2018-01-09 (×2): via INTRAVENOUS

## 2018-01-09 MED ORDER — OXYTOCIN 40 UNITS IN LACTATED RINGERS INFUSION - SIMPLE MED
1.0000 m[IU]/min | INTRAVENOUS | Status: DC
  Administered 2018-01-09: 2 m[IU]/min via INTRAVENOUS

## 2018-01-09 MED ORDER — COCONUT OIL OIL: 1.0000 "application " | TOPICAL_OIL | Status: DC | PRN

## 2018-01-09 MED ORDER — OXYTOCIN BOLUS FROM INFUSION
500.0000 mL | Freq: Once | INTRAVENOUS | Status: AC
  Administered 2018-01-09: 500 mL via INTRAVENOUS

## 2018-01-09 MED ORDER — ONDANSETRON HCL 4 MG/2ML IJ SOLN: 4.0000 mg | Freq: Four times a day (QID) | INTRAMUSCULAR | Status: DC | PRN

## 2018-01-09 MED ORDER — WITCH HAZEL-GLYCERIN EX PADS: 1.0000 "application " | MEDICATED_PAD | CUTANEOUS | Status: DC | PRN

## 2018-01-09 MED ORDER — LABETALOL HCL 200 MG PO TABS
200.0000 mg | ORAL_TABLET | Freq: Three times a day (TID) | ORAL | Status: DC
  Administered 2018-01-09 – 2018-01-11 (×5): 200 mg via ORAL
  Filled 2018-01-09 (×5): qty 1

## 2018-01-09 MED ORDER — ACETAMINOPHEN 325 MG PO TABS: 650.0000 mg | ORAL_TABLET | ORAL | Status: DC | PRN

## 2018-01-09 MED ORDER — LACTATED RINGERS IV SOLN
500.0000 mL | Freq: Once | INTRAVENOUS | Status: AC
  Administered 2018-01-09: 250 mL via INTRAVENOUS

## 2018-01-09 MED ORDER — BENZOCAINE-MENTHOL 20-0.5 % EX AERO
1.0000 "application " | INHALATION_SPRAY | CUTANEOUS | Status: DC | PRN
  Administered 2018-01-09: 1 via TOPICAL
  Filled 2018-01-09: qty 56

## 2018-01-09 MED ORDER — DIPHENHYDRAMINE HCL 50 MG/ML IJ SOLN: 12.5000 mg | INTRAMUSCULAR | Status: DC | PRN

## 2018-01-09 MED ORDER — SENNOSIDES-DOCUSATE SODIUM 8.6-50 MG PO TABS
2.0000 | ORAL_TABLET | ORAL | Status: DC
  Administered 2018-01-09 – 2018-01-10 (×2): 2 via ORAL
  Filled 2018-01-09 (×2): qty 2

## 2018-01-09 MED ORDER — LIDOCAINE HCL (PF) 1 % IJ SOLN
30.0000 mL | INTRAMUSCULAR | Status: DC | PRN
  Administered 2018-01-09: 30 mL via SUBCUTANEOUS
  Filled 2018-01-09: qty 30

## 2018-01-09 NOTE — H&P (Signed)
Julie Contreras is a 32 y.o. female presenting for SROM at 0100.  She started CTX afterwards with increasing intensity.  No VB.  Active FM.  Antepartum course complicated by Z0CH.  GBS negative.  In MAU, BPs were in the mild range.  Patient has dull HA but no other complaints.  Pre-eclampsia labs wnl.  Comfortable with epidural.  OB History    Gravida  1   Para      Term      Preterm      AB      Living        SAB      TAB      Ectopic      Multiple      Live Births             Past Medical History:  Diagnosis Date  . Gestational diabetes   . Headache   . PONV (postoperative nausea and vomiting)    Past Surgical History:  Procedure Laterality Date  . ARTHROSCOPIC REPAIR ACL Right    2004  . TUMOR REMOVAL N/A    located on ovary   Family History: family history includes Bladder Cancer in her father and paternal grandfather; Diabetes in her maternal grandmother; Kidney cancer in her maternal grandfather; Melanoma in her mother. Social History:  reports that she has never smoked. She has never used smokeless tobacco. She reports that she drank alcohol. She reports that she has current or past drug history.     Maternal Diabetes: Yes:  Diabetes Type:  Diet controlled Genetic Screening: Normal Maternal Ultrasounds/Referrals: Normal Fetal Ultrasounds or other Referrals:  None Maternal Substance Abuse:  No Significant Maternal Medications:  None Significant Maternal Lab Results:  Lab values include: Group B Strep negative Other Comments:  None  ROS Maternal Medical History:  Reason for admission: Rupture of membranes.   Contractions: Onset was 3-5 hours ago.   Frequency: regular.   Perceived severity is moderate.    Fetal activity: Perceived fetal activity is normal.   Last perceived fetal movement was within the past hour.    Prenatal complications: no prenatal complications Prenatal Complications - Diabetes: gestational. Diabetes is managed by diet.       Dilation: 5 Effacement (%): 90 Station: -2 Exam by:: Adrian Prince, RNC Blood pressure 136/79, pulse (!) 58, temperature 98.2 F (36.8 C), temperature source Oral, resp. rate 16, height 5\' 5"  (1.651 m), weight 208 lb (94.3 kg), SpO2 99 %. Maternal Exam:  Uterine Assessment: Contraction strength is moderate.  Contraction frequency is irregular.   Abdomen: Patient reports no abdominal tenderness. Fundal height is c/w dates.   Estimated fetal weight is 7#8.       Physical Exam  Constitutional: She is oriented to person, place, and time. She appears well-developed and well-nourished.  GI: Soft. There is no rebound and no guarding.  Neurological: She is alert and oriented to person, place, and time.  Skin: Skin is warm and dry.  Psychiatric: She has a normal mood and affect. Her behavior is normal.    Prenatal labs: ABO, Rh: --/--/B POS (06/16 8527) Antibody: NEG (06/16 7824) Rubella: Immune (11/12 0000) RPR: Nonreactive (11/12 0000)  HBsAg: Negative (11/12 0000)  HIV: Non-reactive (11/12 0000)  GBS: Negative (06/11 0000)   Assessment/Plan: 32yo G1 at 39 weeks with labor -Pitocin 2/2  -Anticipate NSVD -A1DM-glucose q 4  Osualdo Hansell 01/09/2018, 8:57 AM

## 2018-01-09 NOTE — Lactation Note (Signed)
This note was copied from a baby's chart. Lactation Consultation Note  Patient Name: Julie Contreras BOFBP'Z Date: 01/09/2018 Reason for consult: Initial assessment;Term;Primapara;1st time breastfeeding  80 hours old FT female who is being exclusively BF by his mother, she's a P1. Parents very receptive, they had lots of questions. Mom already knows how to hand express, she took BF classes at [facility]. She also has a DEBP at home. RN Levada Dy gave mom a NS # 20 due to difficult latch and brought a DEBP to the room to be set up; she was very proactive. Baby was cueing when entering the room, mom had him wrapped in a blanket at her breast. Offered assistance with latch and mom graciously agreed, LC took baby to the bare right breast just to assess a latch without a NS.  Baby needed some readjustment at the breast, once mom had him really close with his nose touching the breast he was able to stop clicking/dimpling and start sucking actively. A few swallows where heard with stimulation, LC instructed mom to do breast compressions while baby is still latched at the breast since that seems to help with the depth, baby stayed on for 6 minutes before self releasing from the breast. Mom burped baby before switching breasts but by the time LC took baby to the other breast, he was sound asleep and would not wake up. LC swaddled baby per parents request and placed him in his bassinet.  LC gave mom some breast shells to help evert her nipples, she'll start wearing them tomorrow morning, instructions, cleaning and storage was reviewed. LC also offered to set up DEBP but since mom want to try to feed baby without the NS, she's going to wait before she starts double pumping. She'll use her hand pump though for pre-pumping to help evert her nipples. Asked mom to call for assistance when needed and to let her RN know when/if she'll be ready to start double pumping. She'll keep the NS # 20 just in case she still have  difficulty latching baby on.  Encouraged mom to feed baby STS 8-12 times/24 hours or sooner if feeding cues are present. Reviewed BF brochure, BF resources and feeding diary. Parents reported all questions were answered; they're both aware of [facility] services and will call PRN.  Maternal Data Formula Feeding for Exclusion: No Has patient been taught Hand Expression?: Yes Does the patient have breastfeeding experience prior to this delivery?: No  Feeding Feeding Type: Breast Fed Length of feed: 6 min  LATCH Score Latch: Repeated attempts needed to sustain latch, nipple held in mouth throughout feeding, stimulation needed to elicit sucking reflex.  Audible Swallowing: A few with stimulation  Type of Nipple: Everted at rest and after stimulation(semi-flat/short shafted)  Comfort (Breast/Nipple): Soft / non-tender  Hold (Positioning): Assistance needed to correctly position infant at breast and maintain latch.  LATCH Score: 7  Interventions Interventions: Breast feeding basics reviewed;Assisted with latch;Skin to skin;Breast massage;Hand express;Breast compression;Adjust position;Support pillows;Shells;Hand pump  Lactation Tools Discussed/Used Tools: Shells;Pump Nipple shield size: 20 Breast pump type: Manual WIC Program: No Pump Review: Setup, frequency, and cleaning Initiated by:: RN Date initiated:: 01/09/18   Consult Status Consult Status: Follow-up Date: 01/10/18 Follow-up type: In-patient    Darcee Dekker Francene Boyers 01/09/2018, 10:05 PM

## 2018-01-09 NOTE — Anesthesia Pain Management Evaluation Note (Signed)
  CRNA Pain Management Visit Note  Patient: Julie Contreras, 32 y.o., female  "Hello I am a member of the anesthesia team at [facility]. We have an anesthesia team available at all times to provide care throughout the hospital, including epidural management and anesthesia for C-section. I don't know your plan for the delivery whether it a natural birth, water birth, IV sedation, nitrous supplementation, doula or epidural, but we want to meet your pain goals."   1.Was your pain managed to your expectations on prior hospitalizations?   No prior hospitalizations  2.What is your expectation for pain management during this hospitalization?     Epidural  3.How can we help you reach that goal? Epidural in place at time of visit  Record the patient's initial score and the patient's pain goal.   Pain: 3  Pain Goal: 7 [facility] wants you to be able to say your pain was always managed very well.  Rayvon Char 01/09/2018

## 2018-01-09 NOTE — Anesthesia Procedure Notes (Signed)
Epidural Patient location during procedure: OB Start time: 01/09/2018 7:37 AM End time: 01/09/2018 8:00 AM  Staffing Anesthesiologist: Annye Asa, MD Performed: anesthesiologist   Preanesthetic Checklist Completed: patient identified, surgical consent, pre-op evaluation, timeout performed, IV checked, risks and benefits discussed and monitors and equipment checked  Epidural Patient position: sitting Prep: site prepped and draped and DuraPrep Patient monitoring: blood pressure, continuous pulse ox and heart rate Approach: midline Location: L3-L4 Injection technique: LOR air  Needle:  Needle type: Tuohy  Needle gauge: 17 G Needle length: 9 cm Needle insertion depth: 4.5 cm Catheter type: closed end flexible Catheter size: 19 Gauge Catheter at skin depth: 10 cm Test dose: negative (1% lidocaine)  Assessment Events: blood not aspirated, injection not painful, no injection resistance, negative IV test and no paresthesia  Additional Notes Pt identified in Labor room.  Monitors applied. Working IV access confirmed. Sterile prep, drape lumbar spine.  1% lido local L3,4.  #17ga Touhy LOR air at 4.5 cm L 3,4, cath in easily to 10 cm skin. Test dose OK, cath dosed and infusion begun.  Patient asymptomatic, VSS, no heme aspirated, tolerated well.  Jenita Seashore, MDReason for block:procedure for pain

## 2018-01-09 NOTE — Anesthesia Postprocedure Evaluation (Signed)
Anesthesia Post Note  Patient: Julie Contreras  Procedure(s) Performed: AN AD [place]     Patient location during evaluation: Mother Baby Anesthesia Type: Epidural Level of consciousness: awake Pain management: satisfactory to patient Vital Signs Assessment: post-procedure vital signs reviewed and stable Respiratory status: spontaneous breathing Cardiovascular status: stable Anesthetic complications: no    Last Vitals:  Vitals:   01/09/18 1645 01/09/18 1746  BP: 138/85 (!) 152/88  Pulse: 64 65  Resp: 18 20  Temp: 36.9 C 36.7 C  SpO2:      Last Pain:  Vitals:   01/09/18 1746  TempSrc: Oral  PainSc:    Pain Goal: Patients Stated Pain Goal: 3 (01/09/18 0720)               Casimer Lanius

## 2018-01-09 NOTE — MAU Note (Signed)
Leaking fld since 0130. Ctxs since 0100. Headache since 0100. Denies visual disturbances or RUQ pain. 3cm last sve. GDM-diet controlled

## 2018-01-09 NOTE — Anesthesia Preprocedure Evaluation (Addendum)
Anesthesia Evaluation  Patient identified by MRN, date of birth, ID band Patient awake    Reviewed: Allergy & Precautions, NPO status , Patient's Chart, lab work & pertinent test results  History of Anesthesia Complications (+) PONV  Airway Mallampati: II  TM Distance: >3 FB Neck ROM: Full    Dental  (+) Dental Advisory Given   Pulmonary asthma ,    breath sounds clear to auscultation       Cardiovascular negative cardio ROS   Rhythm:Regular Rate:Normal     Neuro/Psych  Headaches,    GI/Hepatic Neg liver ROS, GERD  Medicated and Poorly Controlled,  Endo/Other  diabetes (glu 87), GestationalMorbid obesity  Renal/GU negative Renal ROS     Musculoskeletal   Abdominal (+) + obese,   Peds  Hematology plt 149k   Anesthesia Other Findings   Reproductive/Obstetrics (+) Pregnancy                            Anesthesia Physical Anesthesia Plan  ASA: III  Anesthesia Plan: Epidural   Post-op Pain Management:    Induction:   PONV Risk Score and Plan: 3 and Treatment may vary due to age or medical condition  Airway Management Planned: Natural Airway  Additional Equipment:   Intra-op Plan:   Post-operative Plan:   Informed Consent: I have reviewed the patients History and Physical, chart, labs and discussed the procedure including the risks, benefits and alternatives for the proposed anesthesia with the patient or authorized representative who has indicated his/her understanding and acceptance.   Dental advisory given  Plan Discussed with:   Anesthesia Plan Comments: (Patient identified. Risks/Benefits/Options discussed with patient including but not limited to bleeding, infection, nerve damage, paralysis, failed block, incomplete pain control, headache, blood pressure changes, nausea, vomiting, reactions to medication both or allergic, itching and postpartum back pain. Confirmed with  bedside nurse the patient's most recent platelet count. Confirmed with patient that they are not currently taking any anticoagulation, have any bleeding history or any family history of bleeding disorders. Patient expressed understanding and wished to proceed. All questions were answered. )       Anesthesia Quick Evaluation

## 2018-01-10 LAB — CBC
HEMATOCRIT: 28 % — AB (ref 36.0–46.0)
Hemoglobin: 9.9 g/dL — ABNORMAL LOW (ref 12.0–15.0)
MCH: 31 pg (ref 26.0–34.0)
MCHC: 35.4 g/dL (ref 30.0–36.0)
MCV: 87.8 fL (ref 78.0–100.0)
PLATELETS: 126 10*3/uL — AB (ref 150–400)
RBC: 3.19 MIL/uL — ABNORMAL LOW (ref 3.87–5.11)
RDW: 13.7 % (ref 11.5–15.5)
WBC: 12.3 10*3/uL — ABNORMAL HIGH (ref 4.0–10.5)

## 2018-01-10 NOTE — Progress Notes (Signed)
Patient doing well. No complaints.  BP 134/81 (BP Location: Right Arm)   Pulse 82   Temp 98.9 F (37.2 C) (Oral)   Resp 18   Ht 5\' 5"  (1.651 m)   Wt 94.3 kg (208 lb)   SpO2 99%   Breastfeeding? Unknown   BMI 34.61 kg/m  Results for orders placed or performed during the hospital encounter of 01/09/18 (from the past 24 hour(s))  Glucose, capillary     Status: None   Collection Time: 01/09/18 11:19 AM  Result Value Ref Range   Glucose-Capillary 78 65 - 99 mg/dL  CBC     Status: Abnormal   Collection Time: 01/10/18  5:00 AM  Result Value Ref Range   WBC 12.3 (H) 4.0 - 10.5 K/uL   RBC 3.19 (L) 3.87 - 5.11 MIL/uL   Hemoglobin 9.9 (L) 12.0 - 15.0 g/dL   HCT 28.0 (L) 36.0 - 46.0 %   MCV 87.8 78.0 - 100.0 fL   MCH 31.0 26.0 - 34.0 pg   MCHC 35.4 30.0 - 36.0 g/dL   RDW 13.7 11.5 - 15.5 %   Platelets 126 (L) 150 - 400 K/uL   Abdomen is soft and non tender Lochia WNL  PPD # 1  Doing well Routine care Discharge home tomorrow

## 2018-01-11 MED ORDER — LABETALOL HCL 200 MG PO TABS: 200.0000 mg | ORAL_TABLET | Freq: Three times a day (TID) | ORAL | 1 refills | Status: DC

## 2018-01-11 MED ORDER — IBUPROFEN 600 MG PO TABS: 600.0000 mg | ORAL_TABLET | Freq: Four times a day (QID) | ORAL | 0 refills | Status: DC

## 2018-01-11 NOTE — Lactation Note (Addendum)
This note was copied from a baby's chart. Lactation Consultation Note  Patient Name: Julie Contreras [id number]'M Date: 01/11/2018 Reason for consult: Follow-up assessment  Infant is nursing very well; swallows readily noticed. Mom has not needed to use the nipple shield for latching. Mom was taught signs/sound of swallowing. Specifics of an asymmetric latch shown via [employer] animation to assist her with latching. Mom's questions answered.  Mom is taking labetalol 200mg  TID (L2).   Matthias Hughs [facility] 01/11/2018, 8:24 AM

## 2018-01-11 NOTE — Discharge Summary (Signed)
Obstetric Discharge Summary Reason for Admission: onset of labor Prenatal Procedures: none Intrapartum Procedures: spontaneous vaginal delivery Postpartum Procedures: none Complications-Operative and Postpartum: HTN Hemoglobin  Date Value Ref Range Status  01/10/2018 9.9 (L) 12.0 - 15.0 g/dL Final   HCT  Date Value Ref Range Status  01/10/2018 28.0 (L) 36.0 - 46.0 % Final    Physical Exam:  General: alert Lochia: appropriate Uterine Fundus: firm Incision: healing well DVT Evaluation: No evidence of DVT seen on physical exam.  Discharge Diagnoses: Term Pregnancy-delivered  Discharge Information: Date: 01/11/2018 Activity: pelvic rest Diet: routine Medications: PNV, Ibuprofen and labetalol Condition: stable Instructions: refer to practice specific booklet Discharge to: home [place] Schedule an appointment as soon as possible for a visit in 1 week(s).   Contact information: [address] [place] [address] [phone]           Newborn Data: Live born female  Birth Weight: 7 lb 7.9 oz (3400 g) APGAR: 52, 9  Newborn Delivery   Birth date/time:  01/09/2018 13:52:00 Delivery type:  Vaginal, Spontaneous     Home with mother.  [place] 01/11/2018, 8:04 AM

## 2018-01-13 ENCOUNTER — Inpatient Hospital Stay (HOSPITAL_COMMUNITY): Admission: RE | Admit: 2018-01-13 | Payer: PRIVATE HEALTH INSURANCE | Source: Ambulatory Visit

## 2018-07-05 ENCOUNTER — Encounter: Payer: Self-pay | Admitting: Internal Medicine

## 2018-07-05 ENCOUNTER — Ambulatory Visit (INDEPENDENT_AMBULATORY_CARE_PROVIDER_SITE_OTHER): Payer: 59 | Admitting: Internal Medicine

## 2018-07-05 VITALS — BP 120/70 | HR 85 | Temp 98.5°F | Ht 65.0 in | Wt 179.0 lb

## 2018-07-05 DIAGNOSIS — Z23 Encounter for immunization: Secondary | ICD-10-CM

## 2018-07-05 DIAGNOSIS — R509 Fever, unspecified: Secondary | ICD-10-CM

## 2018-07-05 DIAGNOSIS — R52 Pain, unspecified: Secondary | ICD-10-CM | POA: Diagnosis not present

## 2018-07-05 DIAGNOSIS — J22 Unspecified acute lower respiratory infection: Secondary | ICD-10-CM

## 2018-07-05 LAB — POCT INFLUENZA A/B
INFLUENZA A, POC: NEGATIVE
INFLUENZA B, POC: NEGATIVE

## 2018-07-05 MED ORDER — DOXYCYCLINE HYCLATE 100 MG PO TABS: 100.0000 mg | ORAL_TABLET | Freq: Two times a day (BID) | ORAL | 0 refills | Status: DC

## 2018-07-05 MED ORDER — HYDROCODONE-HOMATROPINE 5-1.5 MG/5ML PO SYRP: 5.0000 mL | ORAL_SOLUTION | Freq: Three times a day (TID) | ORAL | 0 refills | Status: DC | PRN

## 2018-07-05 NOTE — Progress Notes (Signed)
   Subjective:    Patient ID: Julie Contreras, female    DOB: 08-Nov-1985, 32 y.o.   MRN: [id number]  HPI 32 year old Female just stopped breast feeding with fever, cough, and congestion. No shaking chills.Cough is somewhat productive.    Review of Systems     Objective:   Physical Exam  Rapid Flu test negative. VSS. TMs clear. Neck supple without adenopathy. Chest clear with out rales or wheezing.      Assessment & Plan:  Acute lower respiratory infection  Plan: Hycodan 1 teaspoon p.o. every 8 hours as needed cough, doxycycline 100 mg twice daily for 10 days.  Rapid flu test is negative.  Flu vaccine given today.  Rest and drink plenty of fluids.  Use inhaler if needed

## 2018-07-05 NOTE — Patient Instructions (Addendum)
Hycodan 1 teaspoon p.o. every 8 hours as needed for cough.Doxycycline 100 mg bid x 10 days. Use inhaler if needed.  Rest and drink plenty of fluids.

## 2018-08-04 ENCOUNTER — Ambulatory Visit: Payer: Self-pay | Admitting: Internal Medicine

## 2018-08-04 ENCOUNTER — Telehealth: Payer: Self-pay

## 2018-08-04 MED ORDER — ONDANSETRON HCL 4 MG PO TABS: 4.0000 mg | ORAL_TABLET | Freq: Three times a day (TID) | ORAL | 0 refills | Status: DC | PRN

## 2018-08-04 NOTE — Telephone Encounter (Signed)
Left another voicemail to come in at 2:00pm

## 2018-08-04 NOTE — Telephone Encounter (Signed)
Patient called she started feeling extremely dizzy yesterday with nausea and vomiting she wants to know if can call her in something for it?  She doesn't have a headache, fever or chills.

## 2018-08-04 NOTE — Telephone Encounter (Signed)
Calling in Zofran tablets for pt.

## 2018-08-04 NOTE — Telephone Encounter (Signed)
Needs OV.  

## 2018-08-04 NOTE — Telephone Encounter (Signed)
Left voicemail will work her in at 2:00pm, patient to call back to confirm.

## 2018-10-18 DIAGNOSIS — Z23 Encounter for immunization: Secondary | ICD-10-CM

## 2019-05-29 ENCOUNTER — Telehealth: Payer: Self-pay | Admitting: Internal Medicine

## 2019-05-29 ENCOUNTER — Ambulatory Visit (INDEPENDENT_AMBULATORY_CARE_PROVIDER_SITE_OTHER): Payer: 59 | Admitting: Internal Medicine

## 2019-05-29 ENCOUNTER — Other Ambulatory Visit: Payer: Self-pay

## 2019-05-29 ENCOUNTER — Encounter: Payer: Self-pay | Admitting: Internal Medicine

## 2019-05-29 VITALS — BP 130/80 | HR 72 | Ht 65.0 in | Wt 180.0 lb

## 2019-05-29 DIAGNOSIS — M25512 Pain in left shoulder: Secondary | ICD-10-CM | POA: Diagnosis not present

## 2019-05-29 MED ORDER — CYCLOBENZAPRINE HCL 10 MG PO TABS: ORAL_TABLET | ORAL | 0 refills | Status: DC

## 2019-05-29 MED ORDER — METHYLPREDNISOLONE ACETATE 80 MG/ML IJ SUSP
80.0000 mg | Freq: Once | INTRAMUSCULAR | Status: AC
  Administered 2019-05-29: 80 mg via INTRAMUSCULAR

## 2019-05-29 NOTE — Telephone Encounter (Signed)
Julie Contreras [phone]  [name] called to say for the last 2 weeks she has had left shoulder pain that started in the middle of the night. She is unable to get comfortable so she is having trouble sleeping. Would like to come in for you to look at it.

## 2019-05-29 NOTE — Progress Notes (Signed)
l °

## 2019-05-29 NOTE — Telephone Encounter (Signed)
Appointment scheduled.

## 2019-05-29 NOTE — Telephone Encounter (Signed)
OK 

## 2019-05-31 ENCOUNTER — Other Ambulatory Visit: Payer: Self-pay

## 2019-05-31 ENCOUNTER — Ambulatory Visit
Admission: RE | Admit: 2019-05-31 | Discharge: 2019-05-31 | Disposition: A | Discharge status: Home / Self Care | Payer: 59 | Source: Ambulatory Visit | Attending: Internal Medicine | Admitting: Internal Medicine

## 2019-05-31 DIAGNOSIS — M25512 Pain in left shoulder: Secondary | ICD-10-CM

## 2019-06-11 NOTE — Progress Notes (Signed)
   Subjective:    Patient ID: Julie Contreras, female    DOB: May 24, 1986, 33 y.o.   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 No other significant heavy lifting. Some pain extending into trapezius muscle area in addition to shoulder.    Review of Systems see above. No history of arthritis or significant musculoskeletal pain.     Objective:   Physical Exam VS reviewed. Decreased ROM left shoulder. Difficult to raise left arm up over head due to pain. No crepitus in shoulder. Muscle strength LLE is WNL.       Assessment & Plan:  Left shoulder impingement  Plan: Is on Micronor for birth control. Given Flexeril one half tab at bedtime. Ice or heat to shoulder. ROM exercoses demonstrated. Given injection of 1cc each Depomedrol, Marcaine and xylocaine left shoulder. Left shoulder Xray is normal.

## 2019-06-11 NOTE — Patient Instructions (Addendum)
Injection Depomedrol, Marcaine and xylocaine left shoulder. Practice ROM exercise. Take Flexeril sparingly for pain at bedtime. May need PT. Left shoulder Xray is normal.

## 2019-07-28 NOTE — L&D Delivery Note (Signed)
Delivery Note At 1:13 AM a viable female was delivered via Vaginal, Spontaneous (Presentation: Left Occiput Anterior).  APGAR: 9, 9; weight pending.   Placenta status: Spontaneous, Intact.  Cord: 3 vessels with the following complications: None.  Cord pH: n/a  Anesthesia: Epidural Episiotomy: None Lacerations: 2nd degree;Perineal Suture Repair: 3.0 vicryl rapide Est. Blood Loss (mL):  200  Mom to postpartum.  Baby to Couplet care / Skin to Skin.  Linda Hedges 04/13/2020, 1:45 AM

## 2019-10-26 ENCOUNTER — Ambulatory Visit: Payer: 59 | Attending: Internal Medicine

## 2019-10-26 DIAGNOSIS — Z23 Encounter for immunization: Secondary | ICD-10-CM

## 2019-10-26 NOTE — Progress Notes (Signed)
   Covid-19 Vaccination Clinic  Name:  Julie Contreras    MRN: [id number] DOB: 12-04-1985  10/26/2019  [name] was observed post Covid-19 immunization for 15 minutes without incident. She was provided with Vaccine Information Sheet and instruction to access the V-Safe system.   [name] was instructed to call 911 with any severe reactions post vaccine: Marland Kitchen Difficulty breathing  . Swelling of face and throat  . A fast heartbeat  . A bad rash all over body  . Dizziness and weakness   Immunizations Administered    Name Date Dose VIS Date Route   Pfizer COVID-19 Vaccine 10/26/2019 12:27 PM 0.3 mL 07/07/2019 Intramuscular   Manufacturer: [employer], [employer]   Lot: [id number]   [place]: [id number]

## 2019-11-07 ENCOUNTER — Ambulatory Visit (INDEPENDENT_AMBULATORY_CARE_PROVIDER_SITE_OTHER): Payer: 59 | Admitting: Internal Medicine

## 2019-11-07 ENCOUNTER — Encounter: Payer: Self-pay | Admitting: Internal Medicine

## 2019-11-07 ENCOUNTER — Other Ambulatory Visit: Payer: Self-pay

## 2019-11-07 VITALS — Ht 65.0 in | Wt 180.0 lb

## 2019-11-07 DIAGNOSIS — Z0289 Encounter for other administrative examinations: Secondary | ICD-10-CM

## 2019-11-07 DIAGNOSIS — D229 Melanocytic nevi, unspecified: Secondary | ICD-10-CM

## 2019-11-07 NOTE — Progress Notes (Signed)
   Subjective:    Patient ID: Julie Contreras, female    DOB: 05/16/1986, 34 y.o.   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 General health is good. She has about 3 skin lesions she is concerned about. One is on her back.    Review of Systems     Objective:   Physical Exam  Benign appearing nevus on mid back with other benign appearing nevi. Lesions on legs and arms appear normal. Face OK.       Assessment & Plan:  Benign skin lesions. Intrauterine pregnancy.  Patient will continue to monitor skin for changes.

## 2019-11-08 ENCOUNTER — Telehealth: Payer: Self-pay | Admitting: Internal Medicine

## 2019-11-08 NOTE — Telephone Encounter (Signed)
LVM that medical records are ready for pick up.

## 2019-11-20 ENCOUNTER — Ambulatory Visit: Payer: 59 | Attending: Internal Medicine

## 2019-11-20 DIAGNOSIS — Z23 Encounter for immunization: Secondary | ICD-10-CM

## 2019-11-20 NOTE — Progress Notes (Signed)
   Covid-19 Vaccination Clinic  Name:  Marleyann Rowbotham    MRN: [id number] DOB: Dec 27, 1985  11/20/2019  Ms. Graper was observed post Covid-19 immunization for 15 minutes without incident. She was provided with Vaccine Information Sheet and instruction to access the V-Safe system.   Ms. Zakowski was instructed to call 911 with any severe reactions post vaccine: Marland Kitchen Difficulty breathing  . Swelling of face and throat  . A fast heartbeat  . A bad rash all over body  . Dizziness and weakness   Immunizations Administered    Name Date Dose VIS Date Route   Pfizer COVID-19 Vaccine 11/20/2019 10:08 AM 0.3 mL 09/20/2018 Intramuscular   Manufacturer: [employer]   Lot: [id number]   [place]: [id number]

## 2019-11-22 NOTE — Patient Instructions (Signed)
Skin exam shows no current concerns. May want to see Dr. Rinaldo Ratel, Dermatologist for skin check after your new baby is born. It was a pleasure to see you today.

## 2019-12-04 ENCOUNTER — Ambulatory Visit: Payer: 59 | Attending: Internal Medicine

## 2019-12-04 DIAGNOSIS — Z20822 Contact with and (suspected) exposure to covid-19: Secondary | ICD-10-CM

## 2019-12-05 LAB — NOVEL CORONAVIRUS, NAA: SARS-CoV-2, NAA: NOT DETECTED

## 2019-12-05 LAB — SARS-COV-2, NAA 2 DAY TAT

## 2020-01-03 ENCOUNTER — Encounter: Payer: Self-pay | Admitting: Registered"

## 2020-01-03 ENCOUNTER — Other Ambulatory Visit: Payer: Self-pay

## 2020-01-03 ENCOUNTER — Encounter: Payer: 59 | Attending: Obstetrics & Gynecology | Admitting: Registered"

## 2020-01-03 DIAGNOSIS — O99810 Abnormal glucose complicating pregnancy: Secondary | ICD-10-CM

## 2020-01-03 NOTE — Progress Notes (Signed)
Patient was seen on 6/9/212 for Gestational Diabetes self-management class at the [facility]. The following learning objectives were met by the patient during this course:   States the definition of Gestational Diabetes  States why dietary management is important in controlling blood glucose  Describes the effects each nutrient has on blood glucose levels  Demonstrates ability to create a balanced meal plan  Demonstrates carbohydrate counting   States when to check blood glucose levels  Demonstrates proper blood glucose monitoring techniques  States the effect of stress and exercise on blood glucose levels  States the importance of limiting caffeine and abstaining from alcohol and smoking  Blood glucose monitor given: OneTouch Verio Flex Lot# [id number] Exp: 07/26/2021 Blood Glucose: 86 mg/dL   Patient instructed to monitor glucose levels: FBS: 60 - <95; 1 hour: <140; 2 hour: <120  Patient received handouts:  Nutrition Diabetes and Pregnancy, including carb counting list  Patient will be seen for follow-up as needed. 

## 2020-03-03 ENCOUNTER — Ambulatory Visit (HOSPITAL_COMMUNITY)
Admission: EM | Admit: 2020-03-03 | Discharge: 2020-03-03 | Disposition: A | Discharge status: Home / Self Care | Payer: 59 | Attending: Emergency Medicine | Admitting: Emergency Medicine

## 2020-03-03 ENCOUNTER — Other Ambulatory Visit: Payer: Self-pay

## 2020-03-03 ENCOUNTER — Encounter (HOSPITAL_COMMUNITY): Payer: Self-pay

## 2020-03-03 DIAGNOSIS — Z20822 Contact with and (suspected) exposure to covid-19: Secondary | ICD-10-CM

## 2020-03-03 NOTE — Discharge Instructions (Signed)
Monitor MyChart for results

## 2020-03-03 NOTE — ED Triage Notes (Signed)
Pt presents to UC for possible coivd exposure on Thursday 8/5. Pt states she has no symptoms or acute complaints at this time. Pt requesting covid testing only.

## 2020-03-04 LAB — SARS CORONAVIRUS 2 (TAT 6-24 HRS): SARS Coronavirus 2: NEGATIVE

## 2020-03-04 NOTE — ED Provider Notes (Signed)
[facility]    CSN: [id number] Arrival date & time: 03/03/20  1134      History   Chief Complaint Chief Complaint  Patient presents with   Labs Only    HPI Julie Contreras is a 34 y.o. female currently [redacted] weeks pregnant presenting today for possible Covid exposure.  Patient had exposure to someone who has since tested positive on 8/5, approximately 4 days ago.  She is currently asymptomatic and denies any symptoms.  Here for Covid testing.  HPI  Past Medical History:  Diagnosis Date   Gestational diabetes    Headache    PONV (postoperative nausea and vomiting)     Patient Active Problem List   Diagnosis Date Noted   Abnormal glucose tolerance test (GTT) during pregnancy, antepartum 11/12/2017   History of migraine headaches 08/07/2014    Past Surgical History:  Procedure Laterality Date   ARTHROSCOPIC REPAIR ACL Right    2004   TUMOR REMOVAL N/A    located on ovary    OB History    Gravida  2   Para  1   Term  1   Preterm      AB      Living  1     SAB      TAB      Ectopic      Multiple  0   Live Births  1            Home Medications    Prior to Admission medications   Medication Sig Start Date End Date Taking? Authorizing Provider  esomeprazole (NEXIUM) 20 MG packet Take 20 mg by mouth daily before breakfast.    [provider]  Prenatal Vit-Fe Fumarate-FA (PRENATAL MULTIVITAMIN) TABS tablet Take 1 tablet by mouth daily at 12 noon.    [provider]  pyridOXINE (VITAMIN B-6) 100 MG tablet Take 100 mg by mouth daily.    [provider]    Family History Family History  Problem Relation Age of Onset   Melanoma Mother    Bladder Cancer Father    Diabetes Maternal Grandmother    Kidney cancer Maternal Grandfather    Bladder Cancer Paternal Grandfather     Social History Social History   Tobacco Use   Smoking status: Never Smoker   Smokeless tobacco: Never Used    Substance Use Topics   Alcohol use: Not Currently    Alcohol/week: 0.0 standard drinks   Drug use: Not Currently     Allergies   Patient has no known allergies.   Review of Systems Review of Systems  Constitutional: Negative for activity change, appetite change, chills, fatigue and fever.  HENT: Negative for congestion, ear pain, rhinorrhea, sinus pressure, sore throat and trouble swallowing.   Eyes: Negative for discharge and redness.  Respiratory: Negative for cough, chest tightness and shortness of breath.   Cardiovascular: Negative for chest pain.  Gastrointestinal: Negative for abdominal pain, diarrhea, nausea and vomiting.  Musculoskeletal: Negative for myalgias.  Skin: Negative for rash.  Neurological: Negative for dizziness, light-headedness and headaches.     Physical Exam Triage Vital Signs ED Triage Vitals  Enc Vitals Group     BP 03/03/20 1158 113/69     Pulse Rate 03/03/20 1158 87     Resp 03/03/20 1158 16     Temp 03/03/20 1158 98.6 F (37 C)     Temp Source 03/03/20 1158 Oral     SpO2 03/03/20 1158 99 %  Weight --      Height --      Head Circumference --      Peak Flow --      Pain Score 03/03/20 1200 0     Pain Loc --      Pain Edu? --      Excl. in [place]? --    No data found.  Updated Vital Signs BP 113/69 (BP Location: Left Arm)    Pulse 87    Temp 98.6 F (37 C) (Oral)    Resp 16    LMP 07/21/2019 (Approximate)    SpO2 99%   Visual Acuity Right Eye Distance:   Left Eye Distance:   Bilateral Distance:    Right Eye Near:   Left Eye Near:    Bilateral Near:     Physical Exam Vitals and nursing note reviewed.  Constitutional:      Appearance: She is well-developed.     Comments: No acute distress  HENT:     Head: Normocephalic and atraumatic.     Nose: Nose normal.  Eyes:     Conjunctiva/sclera: Conjunctivae normal.  Cardiovascular:     Rate and Rhythm: Normal rate.  Pulmonary:     Effort: Pulmonary effort is normal. No  respiratory distress.  Abdominal:     General: There is no distension.  Musculoskeletal:        General: Normal range of motion.     Cervical back: Neck supple.  Skin:    General: Skin is warm and dry.  Neurological:     Mental Status: She is alert and oriented to person, place, and time.      UC Treatments / Results  Labs (all labs ordered are listed, but only abnormal results are displayed) Labs Reviewed  SARS CORONAVIRUS 2 (TAT 6-24 HRS)    EKG   Radiology No results found.  Procedures Procedures (including critical care time)  Medications Ordered in UC Medications - No data to display  Initial Impression / Assessment and Plan / UC Course  I have reviewed the triage vital signs and the nursing notes.  Pertinent labs & imaging results that were available during my care of the patient were reviewed by me and considered in my medical decision making (see chart for details).     Covid test pending after exposure.  Currently asymptomatic.  Discussed strict return precautions. Patient verbalized understanding and is agreeable with plan.  Final Clinical Impressions(s) / UC Diagnoses   Final diagnoses:  Encounter for laboratory testing for COVID-19 virus  Exposure to COVID-19 virus     Discharge Instructions     Monitor MyChart for results   ED Prescriptions    None     PDMP not reviewed this encounter.   Janith Lima, [place] 03/04/20 [phone]

## 2020-04-12 ENCOUNTER — Other Ambulatory Visit: Payer: Self-pay

## 2020-04-12 ENCOUNTER — Inpatient Hospital Stay (HOSPITAL_COMMUNITY): Payer: 59 | Admitting: Anesthesiology

## 2020-04-12 ENCOUNTER — Encounter (HOSPITAL_COMMUNITY): Payer: Self-pay | Admitting: Obstetrics & Gynecology

## 2020-04-12 ENCOUNTER — Inpatient Hospital Stay (HOSPITAL_COMMUNITY)
Admission: AD | Admit: 2020-04-12 | Discharge: 2020-04-14 | DRG: 807 | Disposition: A | Discharge status: Home / Self Care | Payer: 59 | Attending: Obstetrics & Gynecology | Admitting: Obstetrics & Gynecology

## 2020-04-12 DIAGNOSIS — Z3A38 38 weeks gestation of pregnancy: Secondary | ICD-10-CM

## 2020-04-12 DIAGNOSIS — O24425 Gestational diabetes mellitus in childbirth, controlled by oral hypoglycemic drugs: Secondary | ICD-10-CM | POA: Diagnosis present

## 2020-04-12 DIAGNOSIS — Z20822 Contact with and (suspected) exposure to covid-19: Secondary | ICD-10-CM | POA: Diagnosis present

## 2020-04-12 DIAGNOSIS — O99214 Obesity complicating childbirth: Secondary | ICD-10-CM | POA: Diagnosis present

## 2020-04-12 DIAGNOSIS — O134 Gestational [pregnancy-induced] hypertension without significant proteinuria, complicating childbirth: Principal | ICD-10-CM | POA: Diagnosis present

## 2020-04-12 DIAGNOSIS — E669 Obesity, unspecified: Secondary | ICD-10-CM | POA: Diagnosis present

## 2020-04-12 DIAGNOSIS — O99824 Streptococcus B carrier state complicating childbirth: Secondary | ICD-10-CM | POA: Diagnosis present

## 2020-04-12 DIAGNOSIS — O139 Gestational [pregnancy-induced] hypertension without significant proteinuria, unspecified trimester: Secondary | ICD-10-CM | POA: Diagnosis present

## 2020-04-12 LAB — CBC
HCT: 33.7 % — ABNORMAL LOW (ref 36.0–46.0)
Hemoglobin: 11.2 g/dL — ABNORMAL LOW (ref 12.0–15.0)
MCH: 29.9 pg (ref 26.0–34.0)
MCHC: 33.2 g/dL (ref 30.0–36.0)
MCV: 90.1 fL (ref 80.0–100.0)
Platelets: 239 10*3/uL (ref 150–400)
RBC: 3.74 MIL/uL — ABNORMAL LOW (ref 3.87–5.11)
RDW: 12.7 % (ref 11.5–15.5)
WBC: 12.3 10*3/uL — ABNORMAL HIGH (ref 4.0–10.5)
nRBC: 0 % (ref 0.0–0.2)

## 2020-04-12 LAB — COMPREHENSIVE METABOLIC PANEL
ALT: 13 U/L (ref 0–44)
AST: 17 U/L (ref 15–41)
Albumin: 2.9 g/dL — ABNORMAL LOW (ref 3.5–5.0)
Alkaline Phosphatase: 137 U/L — ABNORMAL HIGH (ref 38–126)
Anion gap: 10 (ref 5–15)
BUN: 7 mg/dL (ref 6–20)
CO2: 20 mmol/L — ABNORMAL LOW (ref 22–32)
Calcium: 8.8 mg/dL — ABNORMAL LOW (ref 8.9–10.3)
Chloride: 107 mmol/L (ref 98–111)
Creatinine, Ser: 0.52 mg/dL (ref 0.44–1.00)
GFR calc Af Amer: >60 mL/min (ref >60)
GFR calc non Af Amer: >60 mL/min (ref >60)
Glucose, Bld: 92 mg/dL (ref 70–99)
Potassium: 3.7 mmol/L (ref 3.5–5.1)
Sodium: 137 mmol/L (ref 135–145)
Total Bilirubin: 0.3 mg/dL (ref 0.3–1.2)
Total Protein: 6.2 g/dL — ABNORMAL LOW (ref 6.5–8.1)

## 2020-04-12 LAB — GLUCOSE, CAPILLARY
Glucose-Capillary: 81 mg/dL (ref 70–99)
Glucose-Capillary: 84 mg/dL (ref 70–99)
Glucose-Capillary: 87 mg/dL (ref 70–99)
Glucose-Capillary: 99 mg/dL (ref 70–99)

## 2020-04-12 LAB — TYPE AND SCREEN
ABO/RH(D): B POS
Antibody Screen: NEGATIVE

## 2020-04-12 LAB — SARS CORONAVIRUS 2 BY RT PCR (HOSPITAL ORDER, PERFORMED IN ~~LOC~~ HOSPITAL LAB): SARS Coronavirus 2: NEGATIVE

## 2020-04-12 MED ORDER — SOD CITRATE-CITRIC ACID 500-334 MG/5ML PO SOLN: 30.0000 mL | ORAL | Status: DC | PRN

## 2020-04-12 MED ORDER — FENTANYL CITRATE (PF) 100 MCG/2ML IJ SOLN
50.0000 ug | INTRAMUSCULAR | Status: DC | PRN
  Administered 2020-04-12: 100 ug via INTRAVENOUS
  Filled 2020-04-12: qty 2

## 2020-04-12 MED ORDER — OXYCODONE-ACETAMINOPHEN 5-325 MG PO TABS: 2.0000 | ORAL_TABLET | ORAL | Status: DC | PRN

## 2020-04-12 MED ORDER — FENTANYL CITRATE (PF) 2500 MCG/50ML IJ SOLN
INTRAMUSCULAR | Status: DC | PRN
Start: 2020-04-12 — End: 2020-04-13
  Administered 2020-04-12: 12 mL/h via EPIDURAL

## 2020-04-12 MED ORDER — EPHEDRINE 5 MG/ML INJ: 10.0000 mg | INTRAVENOUS | Status: DC | PRN

## 2020-04-12 MED ORDER — FENTANYL-BUPIVACAINE-NACL 0.5-0.125-0.9 MG/250ML-% EP SOLN: 12.0000 mL/h | EPIDURAL | Status: DC | PRN

## 2020-04-12 MED ORDER — PHENYLEPHRINE 40 MCG/ML (10ML) SYRINGE FOR IV PUSH (FOR BLOOD PRESSURE SUPPORT): 80.0000 ug | PREFILLED_SYRINGE | INTRAVENOUS | Status: DC | PRN

## 2020-04-12 MED ORDER — ACETAMINOPHEN 325 MG PO TABS: 650.0000 mg | ORAL_TABLET | ORAL | Status: DC | PRN

## 2020-04-12 MED ORDER — OXYTOCIN-SODIUM CHLORIDE 30-0.9 UT/500ML-% IV SOLN
1.0000 m[IU]/min | INTRAVENOUS | Status: DC
  Administered 2020-04-12: 2 m[IU]/min via INTRAVENOUS
  Filled 2020-04-12: qty 500

## 2020-04-12 MED ORDER — SODIUM CHLORIDE 0.9 % IV SOLN
5.0000 10*6.[IU] | Freq: Once | INTRAVENOUS | Status: AC
  Administered 2020-04-12: 5 10*6.[IU] via INTRAVENOUS
  Filled 2020-04-12: qty 5

## 2020-04-12 MED ORDER — LACTATED RINGERS IV SOLN: INTRAVENOUS | Status: DC

## 2020-04-12 MED ORDER — FENTANYL-BUPIVACAINE-NACL 0.5-0.125-0.9 MG/250ML-% EP SOLN
EPIDURAL | Status: AC
Start: 2020-04-12 — End: 2020-04-13
  Filled 2020-04-12: qty 250

## 2020-04-12 MED ORDER — TERBUTALINE SULFATE 1 MG/ML IJ SOLN: 0.2500 mg | Freq: Once | INTRAMUSCULAR | Status: DC | PRN

## 2020-04-12 MED ORDER — PENICILLIN G POT IN DEXTROSE 60000 UNIT/ML IV SOLN
3.0000 10*6.[IU] | INTRAVENOUS | Status: DC
  Administered 2020-04-12: 3 10*6.[IU] via INTRAVENOUS
  Filled 2020-04-12: qty 50

## 2020-04-12 MED ORDER — LIDOCAINE HCL (PF) 1 % IJ SOLN: 30.0000 mL | INTRAMUSCULAR | Status: DC | PRN

## 2020-04-12 MED ORDER — OXYTOCIN BOLUS FROM INFUSION
333.0000 mL | Freq: Once | INTRAVENOUS | Status: AC
  Administered 2020-04-13: 333 mL via INTRAVENOUS

## 2020-04-12 MED ORDER — ONDANSETRON HCL 4 MG/2ML IJ SOLN
4.0000 mg | Freq: Four times a day (QID) | INTRAMUSCULAR | Status: DC | PRN
  Administered 2020-04-12: 4 mg via INTRAVENOUS
  Filled 2020-04-12: qty 2

## 2020-04-12 MED ORDER — OXYTOCIN-SODIUM CHLORIDE 30-0.9 UT/500ML-% IV SOLN: 2.5000 [IU]/h | INTRAVENOUS | Status: DC

## 2020-04-12 MED ORDER — DIPHENHYDRAMINE HCL 50 MG/ML IJ SOLN: 12.5000 mg | INTRAMUSCULAR | Status: DC | PRN

## 2020-04-12 MED ORDER — LACTATED RINGERS IV SOLN: 500.0000 mL | INTRAVENOUS | Status: DC | PRN

## 2020-04-12 MED ORDER — LACTATED RINGERS IV SOLN: 500.0000 mL | Freq: Once | INTRAVENOUS | Status: DC

## 2020-04-12 MED ORDER — LIDOCAINE HCL (PF) 1 % IJ SOLN
INTRAMUSCULAR | Status: DC | PRN
  Administered 2020-04-12 (×2): 4 mL via EPIDURAL

## 2020-04-12 MED ORDER — OXYCODONE-ACETAMINOPHEN 5-325 MG PO TABS: 1.0000 | ORAL_TABLET | ORAL | Status: DC | PRN

## 2020-04-12 NOTE — Anesthesia Preprocedure Evaluation (Signed)
Anesthesia Evaluation  Patient identified by MRN, date of birth, ID band Patient awake    Reviewed: Allergy & Precautions, Patient's Chart, lab work & pertinent test results  History of Anesthesia Complications (+) PONV and history of anesthetic complications  Airway Mallampati: II  TM Distance: >3 FB Neck ROM: Full    Dental  (+) Teeth Intact   Pulmonary neg pulmonary ROS,    Pulmonary exam normal breath sounds clear to auscultation       Cardiovascular hypertension, Normal cardiovascular exam Rhythm:Regular Rate:Normal     Neuro/Psych  Headaches, negative psych ROS   GI/Hepatic Neg liver ROS, GERD  ,  Endo/Other  diabetes, Well Controlled, Gestational, Oral Hypoglycemic AgentsObesity  Renal/GU negative Renal ROS  negative genitourinary   Musculoskeletal negative musculoskeletal ROS (+)   Abdominal (+) + obese,   Peds  Hematology  (+) anemia ,   Anesthesia Other Findings   Reproductive/Obstetrics (+) Pregnancy Gestational DM Gestational HTN                             Anesthesia Physical Anesthesia Plan  ASA: II  Anesthesia Plan: Epidural   Post-op Pain Management:    Induction:   PONV Risk Score and Plan:   Airway Management Planned: Natural Airway  Additional Equipment:   Intra-op Plan:   Post-operative Plan:   Informed Consent: I have reviewed the patients History and Physical, chart, labs and discussed the procedure including the risks, benefits and alternatives for the proposed anesthesia with the patient or authorized representative who has indicated his/her understanding and acceptance.       Plan Discussed with: Anesthesiologist  Anesthesia Plan Comments:         Anesthesia Quick Evaluation

## 2020-04-12 NOTE — Progress Notes (Signed)
Julie Contreras is a 34 y.o. G2P1001 at [redacted]w[redacted]d by ultrasound admitted for induction of labor due to Hypertension.  Subjective: Feeling some relief from CTX s/p Fentanyl x 1.    Objective: BP 138/85   Pulse 74   Temp 98.6 F (37 C) (Oral)   Resp 17   Ht 5\' 5"  (1.651 m)   Wt 90.3 kg   LMP 07/21/2019   BMI 33.14 kg/m  No intake/output data recorded. No intake/output data recorded.  FHT:  FHR: 130 bpm, variability: moderate,  accelerations:  Present,  decelerations:  Absent UC:   regular, every 2-3 minutes SVE:   Dilation: 4 Effacement (%): 70 (75) Station: -1 Exam by:: Dr Lynnette Caffey AROM, clear  Labs: Lab Results  Component Value Date   WBC 12.3 (H) 04/12/2020   HGB 11.2 (L) 04/12/2020   HCT 33.7 (L) 04/12/2020   MCV 90.1 04/12/2020   PLT 239 04/12/2020    Assessment / Plan: Induction of labor due to gestational hypertension,  progressing well on pitocin  GHTN: mostly normal range BPs, normal labs and no symptoms.  Continue to monitor A2DM: CBG <120 Labor: Progressing normally Preeclampsia:  n/a Fetal Wellbeing:  Category I Pain Control:  Planning CLEA; would like now I/D:  n/a Anticipated MOD:  NSVD  Linda Hedges 04/12/2020, 8:20 PM

## 2020-04-12 NOTE — Anesthesia Procedure Notes (Signed)
Epidural Patient location during procedure: OB Start time: 04/12/2020 8:39 PM End time: 04/12/2020 8:47 PM  Staffing Anesthesiologist: Josephine Igo, MD Performed: anesthesiologist   Preanesthetic Checklist Completed: patient identified, IV checked, site marked, risks and benefits discussed, surgical consent, monitors and equipment checked, pre-op evaluation and timeout performed  Epidural Patient position: sitting Prep: DuraPrep and site prepped and draped Patient monitoring: continuous pulse ox and blood pressure Approach: midline Location: L3-L4 Injection technique: LOR air  Needle:  Needle type: Tuohy  Needle gauge: 17 G Needle length: 9 cm and 9 Needle insertion depth: 5 cm cm Catheter type: closed end flexible Catheter size: 19 Gauge Catheter at skin depth: 10 cm Test dose: negative and Other  Assessment Events: blood not aspirated, injection not painful, no injection resistance, no paresthesia and negative IV test  Additional Notes Patient identified. Risks and benefits discussed including failed block, incomplete  Pain control, post dural puncture headache, nerve damage, paralysis, blood pressure Changes, nausea, vomiting, reactions to medications-both toxic and allergic and post Partum back pain. All questions were answered. Patient expressed understanding and wished to proceed. Sterile technique was used throughout procedure. Epidural site was Dressed with sterile barrier dressing. No paresthesias, signs of intravascular injection Or signs of intrathecal spread were encountered.  Patient was more comfortable after the epidural was dosed. Please see RN's note for documentation of vital signs and FHR which are stable. Reason for block:procedure for pain

## 2020-04-12 NOTE — H&P (Signed)
Julie Contreras is a 34 y.o. female G2P1001 at 79 weeks presenting for IOL secondary to [facility]; BPs in office today as high as 158/80.  Patient denies current HA but has had one off and on recently.  No visual disturbance, CP/SOB or RUQ pain.  Patient has h/o GHTN and has taken low dose ASA this pregnancy.  Pregnancy is also complicated by J0KX which has been well controlled on Glyburide 2.5 mg qhs.  Last u/s on 8/23 with EFW 5#3 (61%); symmetric growth.  GBS positive.   OB History    Gravida  2   Para  1   Term  1   Preterm      AB      Living  1     SAB      TAB      Ectopic      Multiple  0   Live Births  1          Past Medical History:  Diagnosis Date  . Gestational diabetes   . Headache   . PONV (postoperative nausea and vomiting)    Past Surgical History:  Procedure Laterality Date  . ARTHROSCOPIC REPAIR ACL Right    2004  . TUMOR REMOVAL N/A    located on ovary   Family History: family history includes Bladder Cancer in her father and paternal grandfather; Diabetes in her maternal grandmother; Kidney cancer in her maternal grandfather; Melanoma in her mother. Social History:  reports that she has never smoked. She has never used smokeless tobacco. She reports previous alcohol use. She reports previous drug use.     Maternal Diabetes: Yes:  Diabetes Type:  Insulin/Medication controlled Genetic Screening: Normal Maternal Ultrasounds/Referrals: Normal Fetal Ultrasounds or other Referrals:  None Maternal Substance Abuse:  No Significant Maternal Medications:  Meds include: Other:  Glyburide Significant Maternal Lab Results:  Group B Strep positive Other Comments:  None  Review of Systems Maternal Medical History:  Reason for admission: Contractions.   Contractions: Onset was 6-12 hours ago.   Frequency: rare.   Perceived severity is mild.    Fetal activity: Perceived fetal activity is normal.   Last perceived fetal movement was within the past  hour.    Prenatal complications: PIH.   Prenatal Complications - Diabetes: gestational. Diabetes is managed by oral agent (monotherapy).        Last menstrual period 07/21/2019, unknown if currently breastfeeding. Maternal Exam:  Uterine Assessment: Contraction strength is mild.  Contraction frequency is rare.   Abdomen: Patient reports no abdominal tenderness. Fundal height is c/w dates.   Estimated fetal weight is 8#.   Fetal presentation: vertex  Introitus: Normal vulva. Pelvis: adequate for delivery.   Cervix: Cervix evaluated by digital exam.     Physical Exam Constitutional:      Appearance: Normal appearance.  HENT:     Head: Normocephalic and atraumatic.  Pulmonary:     Effort: Pulmonary effort is normal.  Abdominal:     Palpations: Abdomen is soft.  Genitourinary:    General: Normal vulva.  Skin:    General: Skin is warm and dry.  Neurological:     Mental Status: She is alert.  Psychiatric:        Mood and Affect: Mood normal.        Behavior: Behavior normal.     Prenatal labs: ABO, Rh:  B pos Antibody:  Negative Rubella:  Immune RPR:   NR HBsAg:   Negative HIV:   NR  GBS:   Positive  Assessment/Plan: 34yo G2P1001 at 38 weeks for IOL secondary to GHTN -GHTN-monitor BP and treat with IV antihypertensives prn.  Magnesium sulfate prn persistent severes -A2DM-CBGs q 2 hours.   -CLEA when desired -PCN for GBS ppx -Start pitocin -Anticipate NSVD   Linda Hedges 04/12/2020, 3:43 PM

## 2020-04-13 LAB — COMPREHENSIVE METABOLIC PANEL
ALT: 13 U/L (ref 0–44)
AST: 17 U/L (ref 15–41)
Albumin: 2.3 g/dL — ABNORMAL LOW (ref 3.5–5.0)
Alkaline Phosphatase: 119 U/L (ref 38–126)
Anion gap: 8 (ref 5–15)
BUN: 7 mg/dL (ref 6–20)
CO2: 22 mmol/L (ref 22–32)
Calcium: 8.4 mg/dL — ABNORMAL LOW (ref 8.9–10.3)
Chloride: 106 mmol/L (ref 98–111)
Creatinine, Ser: 0.52 mg/dL (ref 0.44–1.00)
GFR calc Af Amer: >60 mL/min (ref >60)
GFR calc non Af Amer: >60 mL/min (ref >60)
Glucose, Bld: 105 mg/dL — ABNORMAL HIGH (ref 70–99)
Potassium: 3.8 mmol/L (ref 3.5–5.1)
Sodium: 136 mmol/L (ref 135–145)
Total Bilirubin: 0.5 mg/dL (ref 0.3–1.2)
Total Protein: 5.4 g/dL — ABNORMAL LOW (ref 6.5–8.1)

## 2020-04-13 LAB — CBC
HCT: 31.2 % — ABNORMAL LOW (ref 36.0–46.0)
HCT: 32.3 % — ABNORMAL LOW (ref 36.0–46.0)
Hemoglobin: 10.2 g/dL — ABNORMAL LOW (ref 12.0–15.0)
Hemoglobin: 10.6 g/dL — ABNORMAL LOW (ref 12.0–15.0)
MCH: 29.2 pg (ref 26.0–34.0)
MCH: 29.8 pg (ref 26.0–34.0)
MCHC: 32.7 g/dL (ref 30.0–36.0)
MCHC: 32.8 g/dL (ref 30.0–36.0)
MCV: 89.4 fL (ref 80.0–100.0)
MCV: 90.7 fL (ref 80.0–100.0)
Platelets: 204 10*3/uL (ref 150–400)
Platelets: 217 10*3/uL (ref 150–400)
RBC: 3.49 MIL/uL — ABNORMAL LOW (ref 3.87–5.11)
RBC: 3.56 MIL/uL — ABNORMAL LOW (ref 3.87–5.11)
RDW: 12.5 % (ref 11.5–15.5)
RDW: 12.6 % (ref 11.5–15.5)
WBC: 14.3 10*3/uL — ABNORMAL HIGH (ref 4.0–10.5)
WBC: 14.3 10*3/uL — ABNORMAL HIGH (ref 4.0–10.5)
nRBC: 0 % (ref 0.0–0.2)
nRBC: 0 % (ref 0.0–0.2)

## 2020-04-13 LAB — RPR: RPR Ser Ql: NONREACTIVE

## 2020-04-13 MED ORDER — OXYCODONE-ACETAMINOPHEN 5-325 MG PO TABS: 2.0000 | ORAL_TABLET | ORAL | Status: DC | PRN

## 2020-04-13 MED ORDER — TETANUS-DIPHTH-ACELL PERTUSSIS 5-2.5-18.5 LF-MCG/0.5 IM SUSP: 0.5000 mL | Freq: Once | INTRAMUSCULAR | Status: DC

## 2020-04-13 MED ORDER — OXYCODONE-ACETAMINOPHEN 5-325 MG PO TABS
1.0000 | ORAL_TABLET | ORAL | Status: DC | PRN
  Filled 2020-04-13: qty 1

## 2020-04-13 MED ORDER — ZOLPIDEM TARTRATE 5 MG PO TABS: 5.0000 mg | ORAL_TABLET | Freq: Every evening | ORAL | Status: DC | PRN

## 2020-04-13 MED ORDER — PRENATAL MULTIVITAMIN CH
1.0000 | ORAL_TABLET | Freq: Every day | ORAL | Status: DC
  Administered 2020-04-13: 1 via ORAL
  Filled 2020-04-13: qty 1

## 2020-04-13 MED ORDER — ACETAMINOPHEN 325 MG PO TABS
650.0000 mg | ORAL_TABLET | ORAL | Status: DC | PRN
  Administered 2020-04-13: 650 mg via ORAL
  Filled 2020-04-13: qty 2

## 2020-04-13 MED ORDER — DIPHENHYDRAMINE HCL 25 MG PO CAPS: 25.0000 mg | ORAL_CAPSULE | Freq: Four times a day (QID) | ORAL | Status: DC | PRN

## 2020-04-13 MED ORDER — BENZOCAINE-MENTHOL 20-0.5 % EX AERO
1.0000 "application " | INHALATION_SPRAY | CUTANEOUS | Status: DC | PRN
  Filled 2020-04-13: qty 56

## 2020-04-13 MED ORDER — IBUPROFEN 600 MG PO TABS
600.0000 mg | ORAL_TABLET | Freq: Four times a day (QID) | ORAL | Status: DC
  Administered 2020-04-13 – 2020-04-14 (×4): 600 mg via ORAL
  Filled 2020-04-13 (×4): qty 1

## 2020-04-13 MED ORDER — DIBUCAINE (PERIANAL) 1 % EX OINT: 1.0000 "application " | TOPICAL_OINTMENT | CUTANEOUS | Status: DC | PRN

## 2020-04-13 MED ORDER — SIMETHICONE 80 MG PO CHEW: 80.0000 mg | CHEWABLE_TABLET | ORAL | Status: DC | PRN

## 2020-04-13 MED ORDER — ONDANSETRON HCL 4 MG PO TABS: 4.0000 mg | ORAL_TABLET | ORAL | Status: DC | PRN

## 2020-04-13 MED ORDER — SENNOSIDES-DOCUSATE SODIUM 8.6-50 MG PO TABS
2.0000 | ORAL_TABLET | ORAL | Status: DC
  Administered 2020-04-14: 2 via ORAL
  Filled 2020-04-13: qty 2

## 2020-04-13 MED ORDER — COCONUT OIL OIL: 1.0000 "application " | TOPICAL_OIL | Status: DC | PRN

## 2020-04-13 MED ORDER — ONDANSETRON HCL 4 MG/2ML IJ SOLN
4.0000 mg | INTRAMUSCULAR | Status: DC | PRN
  Administered 2020-04-13: 4 mg via INTRAVENOUS
  Filled 2020-04-13: qty 2

## 2020-04-13 MED ORDER — WITCH HAZEL-GLYCERIN EX PADS: 1.0000 "application " | MEDICATED_PAD | CUTANEOUS | Status: DC | PRN

## 2020-04-13 NOTE — Progress Notes (Addendum)
Post Partum Day 0 Subjective: no complaints, up ad lib, voiding and tolerating PO.  No HA, vision change, RUQ pain, CP/SOB  Objective: Blood pressure 137/73, pulse (!) 59, temperature 97.7 F (36.5 C), temperature source Oral, resp. rate 17, height 5\' 5"  (1.651 m), weight 90.3 kg, last menstrual period 07/21/2019, SpO2 100 %, unknown if currently breastfeeding.  Physical Exam:  General: alert, cooperative and appears stated age Lochia: appropriate Uterine Fundus: firm Incision: healing well, no significant drainage, no dehiscence DVT Evaluation: No evidence of DVT seen on physical exam. Negative Homan's sign. No cords or calf tenderness.  Recent Labs    04/13/20 0339 04/13/20 0428  HGB 10.2* 10.6*  HCT 31.2* 32.3*    Assessment/Plan: Plan for discharge tomorrow and Breastfeeding GHTN-normal range BPs and no symptoms.  Normal labs.  Continue to monitor.   LOS: 1 day   Linda Hedges 04/13/2020, 9:59 AM

## 2020-04-13 NOTE — Lactation Note (Signed)
This note was copied from a baby's chart. Lactation Consultation Note  Patient Name: Julie Contreras Date: 04/13/2020 Reason for consult: Initial assessment;Early term 32-38.6wks  P2 mother whose infant is now 87 hours old.  This is an ETI at 38+1 weeks.  Mother breast fed her first child (now 34 years old) for 6 months.    Baby was asleep STS on mother's chest when I arrived.  Mother stated that baby fed well not long ago and she feels like this last feeding was a good latch.  Mother denied pain with breast feeding.  Reviewed feeding cues and hand expression.  Colostrum container provided and milk storage times discussed.  Finger feeding demonstrated.  Encouraged to feed 8-12 times/24 hours or sooner if baby shows feeding cues.  Mother will call for lactation assistance as needed.  She will feed back any EBM she obtains with hand expression.  Mom made aware of O/P services, breastfeeding support groups, community resources, and our phone # for post-discharge questions. Mother has a DEBP for home use.  No support person present at this time.   Maternal Data Formula Feeding for Exclusion: No Has patient been taught Hand Expression?: Yes Does the patient have breastfeeding experience prior to this delivery?: Yes  Feeding Feeding Type: Breast Fed  LATCH Score Latch: Grasps breast easily, tongue down, lips flanged, rhythmical sucking.  Audible Swallowing: A few with stimulation  Type of Nipple: Everted at rest and after stimulation  Comfort (Breast/Nipple): Soft / non-tender  Hold (Positioning): Assistance needed to correctly position infant at breast and maintain latch.  LATCH Score: 8  Interventions Interventions: Breast feeding basics reviewed;Assisted with latch;Hand express;Adjust position;Support pillows  Lactation Tools Discussed/Used     Consult Status Consult Status: Follow-up Date: 04/14/20 Follow-up type: In-patient    Julie Contreras 04/13/2020, 8:56  AM

## 2020-04-13 NOTE — Anesthesia Postprocedure Evaluation (Signed)
Anesthesia Post Note  Patient: Julie Contreras  Procedure(s) Performed: AN AD [place]     Patient location during evaluation: Mother Baby Anesthesia Type: Epidural Level of consciousness: awake and alert, oriented and patient cooperative Pain management: pain level controlled Vital Signs Assessment: post-procedure vital signs reviewed and stable Respiratory status: spontaneous breathing Cardiovascular status: stable Postop Assessment: no headache, epidural receding, patient able to bend at knees and no signs of nausea or vomiting Anesthetic complications: no Comments: Pain score 0.    No complications documented.  Last Vitals:  Vitals:   04/13/20 0321 04/13/20 0422  BP: 130/85 120/76  Pulse: (!) 53 (!) 58  Resp: 18 16  Temp: 36.6 C 36.7 C  SpO2: 100% 100%    Last Pain:  Vitals:   04/13/20 0617  TempSrc:   PainSc: 0-No pain   Pain Goal:                   [facility]

## 2020-04-14 MED ORDER — IBUPROFEN 600 MG PO TABS
600.0000 mg | ORAL_TABLET | Freq: Four times a day (QID) | ORAL | 0 refills | Status: DC
Start: 2020-04-14 — End: 2021-05-29

## 2020-04-14 NOTE — Progress Notes (Signed)
Post Partum Day 1 Subjective: no complaints, up ad lib, voiding and tolerating PO.  No HA, vision changes, RUQ pain, CP/SOB  Objective: Blood pressure 121/66, pulse (!) 55, temperature 97.9 F (36.6 C), temperature source Oral, resp. rate 16, height 5\' 5"  (1.651 m), weight 90.3 kg, last menstrual period 07/21/2019, SpO2 98 %, unknown if currently breastfeeding.  Physical Exam:  General: alert, cooperative and appears stated age Lochia: appropriate Uterine Fundus: firm Incision: healing well, no significant drainage, no dehiscence DVT Evaluation: No evidence of DVT seen on physical exam. Negative Homan's sign. No cords or calf tenderness.  Recent Labs    04/13/20 0339 04/13/20 0428  HGB 10.2* 10.6*  HCT 31.2* 32.3*    Assessment/Plan: Discharge home and Breastfeeding  GHTN-normal range BPs following delivery.  No s/sx.     LOS: 2 days   Linda Hedges 04/14/2020, 9:18 AM

## 2020-04-14 NOTE — Lactation Note (Signed)
This note was copied from a baby's chart. Lactation Consultation Note  Patient Name: Julie Contreras [id number] Date: 04/14/2020 Reason for consult: Follow-up assessment  P2 mother whose infant is now 85 hours old.  This is an ETI at 38+1 weeks.  Mother breast fed her first child ( now 34 years old) for 6 months.  Baby was asleep in mother's arms when I arrived.  Mother had no questions/concerns related to breast feeding.  Mother feels like baby is latching and feeding well; she does have a strong suck.    Engorgement prevention/treatment reviewed.  Provided coconut oil for home use.  Mother has a manual pump and a DEBP for home use.  No support person present at this time.  Mother has been discharged and is packed and ready to leave the hospital.  She has our OP phone number for any questions after discharge.   Maternal Data    Feeding    LATCH Score                   Interventions    Lactation Tools Discussed/Used     Consult Status Consult Status: Complete Date: 04/14/20 Follow-up type: Call as needed    Lakaisha Danish R Blanche Scovell 04/14/2020, 10:51 AM

## 2020-04-14 NOTE — Discharge Instructions (Signed)
Call MD for T>100.4, heavy vaginal bleeding, severe abdominal pain, or respiratory distress.  Call office to schedule postpartum visit in 6 weeks.  Pelvic rest x 6 weeks.   

## 2020-04-14 NOTE — Discharge Summary (Signed)
Postpartum Discharge Summary     Patient Name: Julie Contreras DOB: 12-Jun-1986 MRN: [id number]  Date of admission: 04/12/2020 Delivery date:04/13/2020  Delivering provider: Linda Hedges  Date of discharge: 04/14/2020  Admitting diagnosis: Gestational hypertension [O13.9] Intrauterine pregnancy: [redacted]w[redacted]d    Secondary diagnosis:  Active Problems:   Gestational hypertension  Additional problems: A2DM    Discharge diagnosis: Term Pregnancy Delivered                                              Post partum procedures:none Augmentation: AROM and Pitocin Complications: None  Hospital course: Induction of Labor With Vaginal Delivery   34y.o. yo G2P1001 at 359w2das admitted to the hospital 04/12/2020 for induction of labor.  Indication for induction: Gestational hypertension.  Patient had an uncomplicated labor course as follows: Membrane Rupture Time/Date: 8:07 PM ,04/12/2020   Delivery Method:Vaginal, Spontaneous  Episiotomy: None  Lacerations:  2nd degree;Perineal  Details of delivery can be found in separate delivery note.  Patient had a routine postpartum course. Patient is discharged home 04/14/20.  Newborn Data: Birth date:04/13/2020  Birth time:1:13 AM  Gender:Female  Living status:Living  Apgars:9 ,9  Weight:2991 g   Magnesium Sulfate received: No BMZ received: No Rhophylac:No MMR:No T-DaP:Given prenatally Flu: No Transfusion:No  Physical exam  Vitals:   04/13/20 1225 04/13/20 1536 04/13/20 2030 04/14/20 0454  BP: 117/71 120/68 122/66 121/66  Pulse: 64 (!) 52 72 (!) 55  Resp: '19 18 17 16  ' Temp: 98.2 F (36.8 C) 98 F (36.7 C) 97.9 F (36.6 C) 97.9 F (36.6 C)  TempSrc: Oral  Oral Oral  SpO2: 100% 100% 99% 98%  Weight:      Height:       General: alert, cooperative and no distress Lochia: appropriate Uterine Fundus: firm Incision: Healing well with no significant drainage, No significant erythema DVT Evaluation: No evidence of DVT seen on physical  exam. Negative Homan's sign. No cords or calf tenderness. Labs: Lab Results  Component Value Date   WBC 14.3 (H) 04/13/2020   HGB 10.6 (L) 04/13/2020   HCT 32.3 (L) 04/13/2020   MCV 90.7 04/13/2020   PLT 217 04/13/2020   CMP Latest Ref Rng & Units 04/13/2020  Glucose 70 - 99 mg/dL 105(H)  BUN 6 - 20 mg/dL 7  Creatinine 0.44 - 1.00 mg/dL 0.52  Sodium 135 - 145 mmol/L 136  Potassium 3.5 - 5.1 mmol/L 3.8  Chloride 98 - 111 mmol/L 106  CO2 22 - 32 mmol/L 22  Calcium 8.9 - 10.3 mg/dL 8.4(L)  Total Protein 6.5 - 8.1 g/dL 5.4(L)  Total Bilirubin 0.3 - 1.2 mg/dL 0.5  Alkaline Phos 38 - 126 U/L 119  AST 15 - 41 U/L 17  ALT 0 - 44 U/L 13   Edinburgh Score: Edinburgh Postnatal Depression Scale Screening Tool 04/13/2020  I have been able to laugh and see the funny side of things. 0  I have looked forward with enjoyment to things. 1  I have blamed myself unnecessarily when things went wrong. 0  I have been anxious or worried for no good reason. 1  I have felt scared or panicky for no good reason. 0  Things have been getting on top of me. 1  I have been so unhappy that I have had difficulty sleeping. 0  I have felt sad  or miserable. 1  I have been so unhappy that I have been crying. 1  The thought of harming myself has occurred to me. 0  Edinburgh Postnatal Depression Scale Total 5     After visit meds:  Allergies as of 04/14/2020   No Known Allergies     Medication List    STOP taking these medications   aspirin EC 81 MG tablet   esomeprazole 20 MG packet Commonly known as: NEXIUM   glyBURIDE 2.5 MG tablet Commonly known as: DIABETA   pyridOXINE 100 MG tablet Commonly known as: VITAMIN B-6     TAKE these medications   ibuprofen 600 MG tablet Commonly known as: ADVIL Take 1 tablet (600 mg total) by mouth every 6 (six) hours.   prenatal multivitamin Tabs tablet Take 1 tablet by mouth daily at 12 noon.        Discharge home in stable condition Infant Feeding:  Breast Infant Disposition:home with mother Discharge instruction: per After Visit Summary and Postpartum booklet. Activity: Advance as tolerated. Pelvic rest for 6 weeks.  Diet: routine diet Future Appointments:No future appointments. Follow up Visit: 6 weeks PPV  04/14/2020 Linda Hedges, DO

## 2020-04-23 ENCOUNTER — Inpatient Hospital Stay (HOSPITAL_COMMUNITY): Payer: 59

## 2020-04-23 ENCOUNTER — Inpatient Hospital Stay (HOSPITAL_COMMUNITY): Admission: AD | Admit: 2020-04-23 | Payer: 59 | Source: Home / Self Care | Admitting: Obstetrics & Gynecology

## 2020-10-29 IMAGING — CR DG SHOULDER 2+V*L*
3 series · 3 of 3 positions shown · non-contrast
Comparison: None

CLINICAL DATA: LEFT pain

EXAM:
LEFT SHOULDER - 2+ VIEW

[w shoulder grashey left]
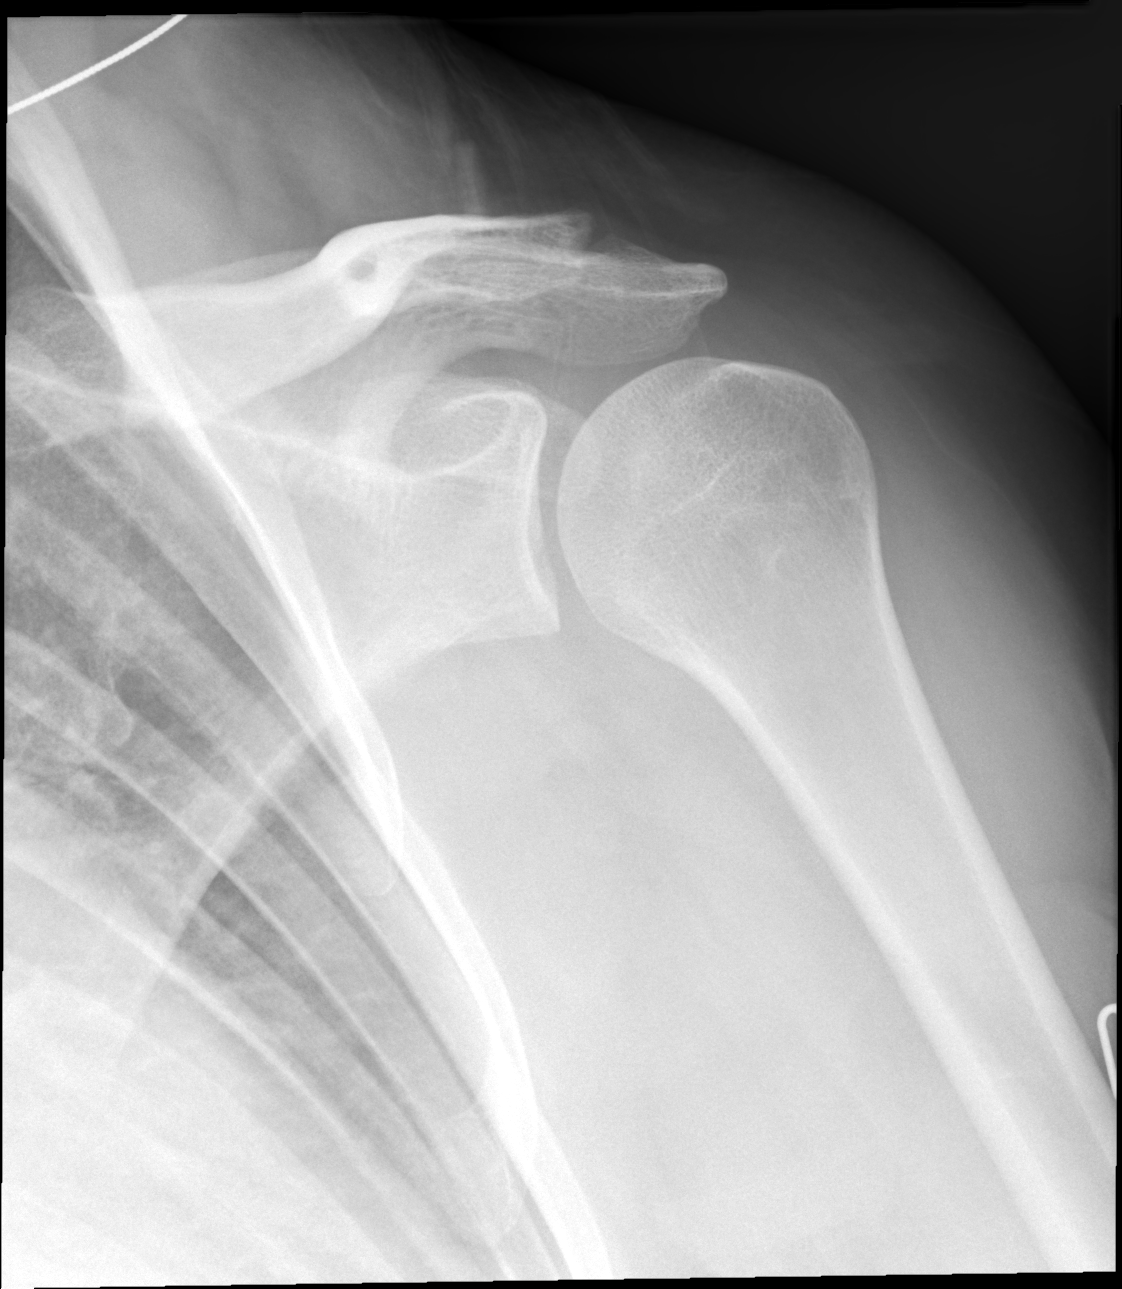

[w shoulder y-view left]
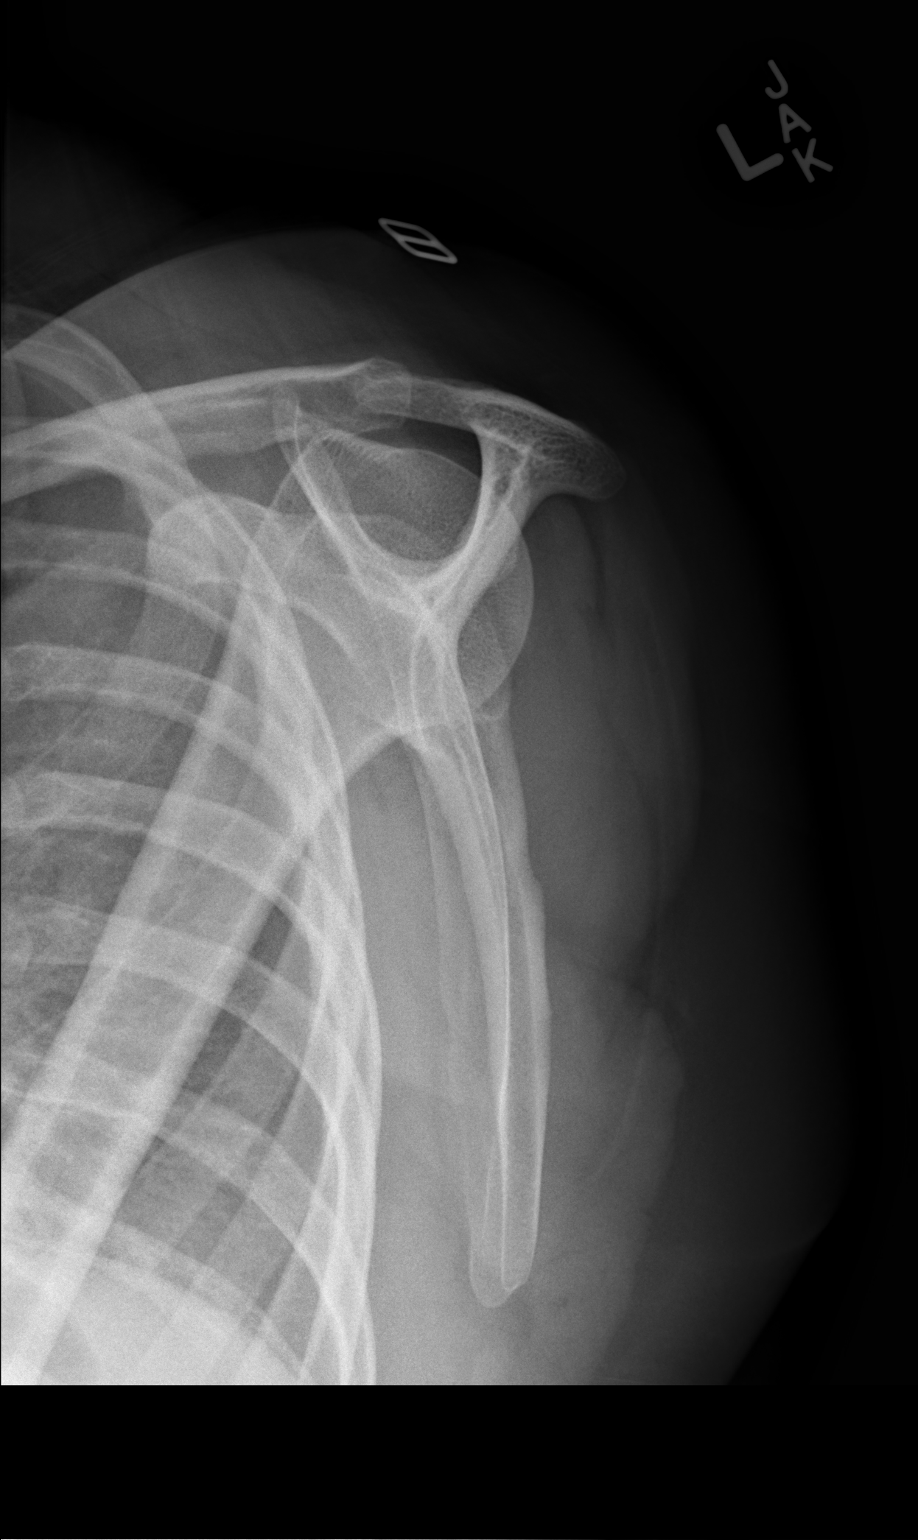

[w shoulder axillary left]
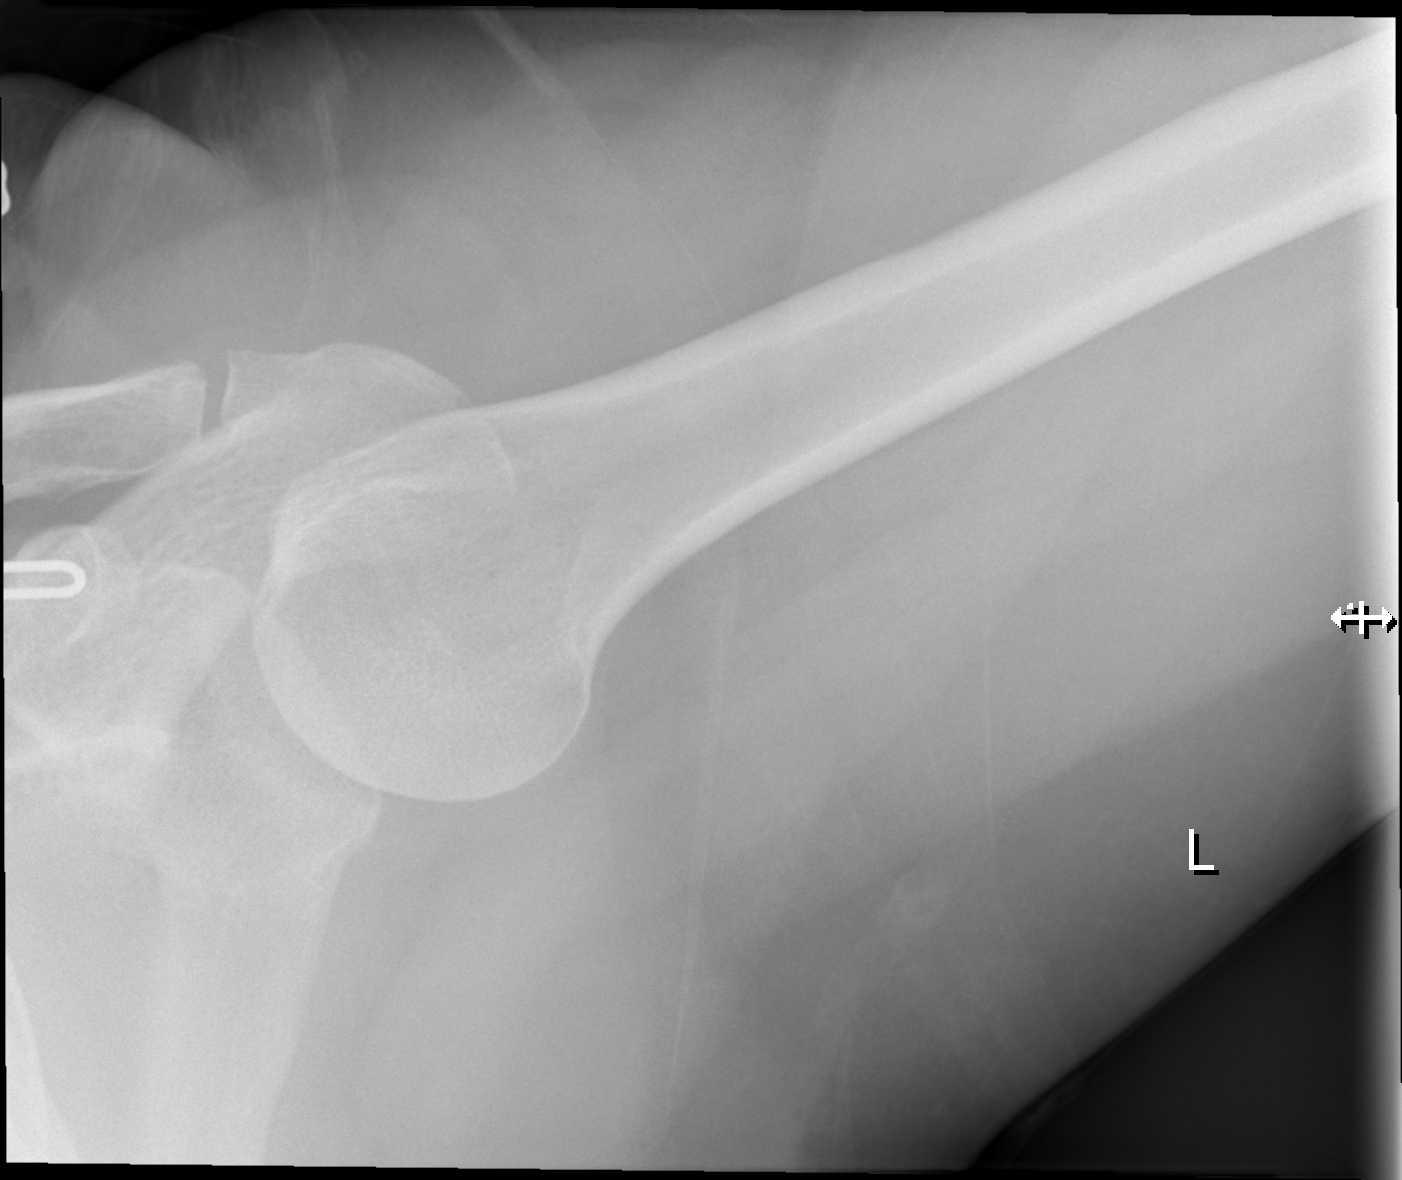

[3 of 3 positions shown; findings below may reference images not displayed]

FINDINGS: Osseous mineralization normal.

AC joint alignment normal.

Visualized LEFT ribs intact.

No acute fracture, dislocation, or bone destruction.
IMPRESSION: Normal exam.

## 2021-03-20 ENCOUNTER — Other Ambulatory Visit: Payer: Self-pay

## 2021-03-20 ENCOUNTER — Ambulatory Visit (INDEPENDENT_AMBULATORY_CARE_PROVIDER_SITE_OTHER): Payer: 59 | Admitting: Internal Medicine

## 2021-03-20 ENCOUNTER — Encounter: Payer: Self-pay | Admitting: Internal Medicine

## 2021-03-20 VITALS — BP 140/80 | HR 80 | Ht 65.0 in | Wt 182.0 lb

## 2021-03-20 DIAGNOSIS — Z131 Encounter for screening for diabetes mellitus: Secondary | ICD-10-CM

## 2021-03-20 DIAGNOSIS — R8290 Unspecified abnormal findings in urine: Secondary | ICD-10-CM

## 2021-03-20 DIAGNOSIS — R350 Frequency of micturition: Secondary | ICD-10-CM | POA: Diagnosis not present

## 2021-03-20 DIAGNOSIS — Z6830 Body mass index (BMI) 30.0-30.9, adult: Secondary | ICD-10-CM

## 2021-03-20 DIAGNOSIS — R635 Abnormal weight gain: Secondary | ICD-10-CM

## 2021-03-20 LAB — POCT URINALYSIS DIPSTICK
Bilirubin, UA: NEGATIVE
Glucose, UA: NEGATIVE
Ketones, UA: NEGATIVE
Nitrite, UA: NEGATIVE
Protein, UA: NEGATIVE
Spec Grav, UA: 1.015 (ref 1.010–1.025)
Urobilinogen, UA: 0.2 E.U./dL
pH, UA: 6 (ref 5.0–8.0)

## 2021-03-20 MED ORDER — FLUCONAZOLE 150 MG PO TABS: ORAL_TABLET | ORAL | 1 refills | Status: DC

## 2021-03-20 MED ORDER — CIPROFLOXACIN HCL 250 MG PO TABS: 250.0000 mg | ORAL_TABLET | Freq: Two times a day (BID) | ORAL | 0 refills | Status: AC

## 2021-03-20 NOTE — Progress Notes (Signed)
   Subjective:    Patient ID: Julie Contreras, female    DOB: 07-Jan-1986, 35 y.o.   MRN: [id number]  HPI 35 year old Female with urinary frequency.  This started rather suddenly.  No fever chills nausea or vomiting.  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  She is married and has 2 children.  No fever, chills, significant back pain, nausea or vomiting. There is trace occult blood on urine dipstick.  Urine will be sent for microscopic and culture.  Since she is symptomatic she will be treated with Cipro 250 mg twice daily for 7 days along with Diflucan 150 mg tablet should she develop Candida vaginitis while on antibiotics.  Review of Systems she also would like to be screened for diabetes.  Hemoglobin A1c drawn and is normal at 5.5%..  This represents glucose control over the past 4 to 6 weeks.     Objective:   Physical Exam She is afebrile.  She has no CVA tenderness. Blood pressure is 140/80 pulse 80 pulse oximetry 98% weight 182 pounds BMI 30.29 Weights reviewed.  In 2019 she weighed 179 pounds with BMI 29.79  In 2018 she weighed 167 pounds  In 2015 she weighed 159 pounds  In 2013 she weighed 148 pounds at [facility] in [place]  Her TSH was checked in 2015 and was normal at 1.8     Assessment & Plan:  Urine was sent for microscopic evaluation and was negative for occult blood.  No red blood cells were seen.  White blood cells were 0-5 per high-powered field which is an acceptable value and not consistent with UTI.  No bacteria seen.  No casts were present.  Suspect she has urethritis and would finish course of Cipro as prescribed for 7 days.  Screen for diabetes-Hemoglobin A1c is normal-she does not have diabetes mellitus  Weight gain-no recent TSH.  We could check that but she would need to return for additional lab work.  I have suggested [facility] for weight loss management.  She will have considerable  lab work there including thyroid functions if she enrolls in that program.  Diflucan prescribed 150 mg tablet should she develop Candida vaginitis while on antibiotics for urinary frequency.

## 2021-03-20 NOTE — Patient Instructions (Addendum)
Cipro 250 mg twice daily x  7 days for UTI symptoms. Culture sent. Hgb AIC checked for possible glucose intolerance. Recommend [facility] for weight loss.  Thyroid functions can be checked with initial intake at that clinic or through [facility].  Diflucan prescribed should she develop Candida vaginitis while on antibiotics

## 2021-03-21 LAB — HEMOGLOBIN A1C
Hgb A1c MFr Bld: 5.5 % of total Hgb (ref <5.7)
Mean Plasma Glucose: 111 mg/dL
eAG (mmol/L): 6.2 mmol/L

## 2021-03-21 LAB — URINALYSIS, MICROSCOPIC ONLY
Bacteria, UA: NONE SEEN /HPF
Hyaline Cast: NONE SEEN /LPF
RBC / HPF: NONE SEEN /HPF (ref 0–2)

## 2021-03-21 LAB — URINE CULTURE
MICRO NUMBER:: 12290950
SPECIMEN QUALITY:: ADEQUATE

## 2021-03-21 LAB — PREGNANCY, URINE: Preg Test, Ur: NEGATIVE

## 2021-03-27 ENCOUNTER — Other Ambulatory Visit: Payer: Self-pay

## 2021-03-27 ENCOUNTER — Encounter: Payer: Self-pay | Admitting: Internal Medicine

## 2021-03-27 ENCOUNTER — Telehealth: Payer: Self-pay | Admitting: Internal Medicine

## 2021-03-27 ENCOUNTER — Ambulatory Visit (INDEPENDENT_AMBULATORY_CARE_PROVIDER_SITE_OTHER): Payer: 59 | Admitting: Internal Medicine

## 2021-03-27 VITALS — BP 100/80 | HR 77 | Ht 65.0 in | Wt 180.0 lb

## 2021-03-27 DIAGNOSIS — M7542 Impingement syndrome of left shoulder: Secondary | ICD-10-CM | POA: Diagnosis not present

## 2021-03-27 DIAGNOSIS — M25512 Pain in left shoulder: Secondary | ICD-10-CM

## 2021-03-27 MED ORDER — METHYLPREDNISOLONE ACETATE 80 MG/ML IJ SUSP
80.0000 mg | Freq: Once | INTRAMUSCULAR | Status: AC
  Administered 2021-03-27: 80 mg via INTRAMUSCULAR

## 2021-03-27 MED ORDER — CYCLOBENZAPRINE HCL 10 MG PO TABS: ORAL_TABLET | ORAL | 1 refills | Status: DC

## 2021-03-27 NOTE — Telephone Encounter (Signed)
Pt called and was wanting to see if she can be seen today, she is having neck and shoulder pain again like she did a year or two ago but was in her other shoulder. When would you like her to come in?

## 2021-03-27 NOTE — Progress Notes (Signed)
   Subjective:    Patient ID: Julie Contreras, female    DOB: 02-02-1986, 35 y.o.   MRN: [id number]  HPI 35 year old Female with 2 young children presents with pain and decreased ROM left shoulder. Daughter fell asleep in patient's arms near left shoulder and remained there for several hours. Patient later noticed shoulder pain.  She and her family are leaving for a beach vacation .  She had a similar issue with her first baby in November 2020.  Had decreased range of motion of left shoulder and it was difficult for her to raise her left arm up overhead due to pain.  Shoulder was injected with Depo-Medrol Marcaine and Xylocaine.  Was given Flexeril to take at bedtime and was advised to use ice or heat to her shoulder.  She had left shoulder x-ray that was normal and she subsequently improved.  She would like to have an injection today.    Review of Systems has not had shoulder issues since 2020 until today     Objective:   Physical Exam Vital signs reviewed.  Decreased range of motion in left shoulder secondary to pain.  Muscle strength is normal in the left upper extremity.       Assessment & Plan:  Left shoulder impingement  Plan: Left shoulder was injected with Depo-Medrol Marcaine and Xylocaine.  She was given a prescription for Flexeril 10 mg to take  1/2 to 1 tablet at bedtime.  May take Aleve or Advil over-the-counter.  Recommend ice or heat to the shoulder area 20 minutes 2-3 times daily.  Do range of motion exercises as demonstrated with the shoulder to keep it moving while at the beach.  Call if not improving upon return from the beach trip.

## 2021-03-27 NOTE — Patient Instructions (Addendum)
May take Flexeril 10 mg one half to one tab at bedtime, Alternate ice or heat as needed. Given Depomedrol, Xylocaine and Marcaine in left shoulder today. Avoid heavy lifting and move shoulder often.

## 2021-04-28 ENCOUNTER — Telehealth: Payer: Self-pay | Admitting: Internal Medicine

## 2021-04-28 NOTE — Telephone Encounter (Signed)
LVM to CB, need to also get more information about what she needs to be seen for.

## 2021-04-28 NOTE — Telephone Encounter (Signed)
Julie Contreras [phone]  [name] called to say she would like a referral to a spine doctor or neurology that she is not getting any better going to the chiropractor. She would like to see Dr Lynann Bologna at [facility] ortho if possible. I let her know I do not think she needs a referral to see him. She has called over there but they have not called her back yet.

## 2021-05-22 ENCOUNTER — Telehealth: Payer: Self-pay

## 2021-05-22 NOTE — Telephone Encounter (Signed)
I also have the notes from ortho.

## 2021-05-22 NOTE — Telephone Encounter (Signed)
scheduled

## 2021-05-22 NOTE — Telephone Encounter (Signed)
Patient had an MRI with Dr Marily Lente  and she would like to have an appt with you to discuss and get a referral for second opinion. Please advise.

## 2021-05-29 ENCOUNTER — Telehealth: Payer: Self-pay | Admitting: Internal Medicine

## 2021-05-29 ENCOUNTER — Encounter: Payer: Self-pay | Admitting: Internal Medicine

## 2021-05-29 ENCOUNTER — Ambulatory Visit (INDEPENDENT_AMBULATORY_CARE_PROVIDER_SITE_OTHER): Payer: 59 | Admitting: Internal Medicine

## 2021-05-29 ENCOUNTER — Other Ambulatory Visit: Payer: Self-pay

## 2021-05-29 VITALS — BP 112/70 | HR 67 | Temp 98.1°F | Ht 65.0 in | Wt 176.5 lb

## 2021-05-29 DIAGNOSIS — H6503 Acute serous otitis media, bilateral: Secondary | ICD-10-CM | POA: Diagnosis not present

## 2021-05-29 DIAGNOSIS — M5412 Radiculopathy, cervical region: Secondary | ICD-10-CM | POA: Diagnosis not present

## 2021-05-29 DIAGNOSIS — M542 Cervicalgia: Secondary | ICD-10-CM

## 2021-05-29 MED ORDER — AZITHROMYCIN 250 MG PO TABS: ORAL_TABLET | ORAL | 0 refills | Status: AC

## 2021-05-29 NOTE — Telephone Encounter (Signed)
Faxed request for Medical Records to Dr Katherina Right at [facility] for dates August 2022 to November 2022, fax number [phone]

## 2021-05-29 NOTE — Progress Notes (Signed)
Subjective:    Patient ID: Julie Contreras, female    DOB: 07/16/1986, 35 y.o.   MRN: [id number]  HPI 35 year old Female seen regarding recent diagnosis of cervical disc  by Dr. Lynann Bologna.  She says surgery has been recommended.  She called  on April 28, 2021 saying that she was not getting better with musculoskeletal pain seeing a Chiropractor and wanted to see Dr. Lynann Bologna. Staff indicated she did not need a formal referral  from [place] to see him.  Patient saw Dr. Lynann Bologna from October 25.  She stated her neck pain began in August continued for some 2 weeks and then she developed left arm pain.  Has had some tingling in her hand and wrist.  Physical therapy has not helped.  Chiropractic therapy has not helped.  Now beginning to have numbness in her hand and is having some difficulty distinguishing when she is holding something in her hand.  Had MRI of the C-spine in October 19 and per Dr. Lynann Bologna was found to have left-sided disc protrusion C5-C6 resulting in moderate left foraminal stenosis.  There was a central disc bulge with some edema at C6-C7 resulting in contact but no substantial compression of the spinal cord.  No substantial neuroforaminal stenosis at that level.   Patient says she has had  persistent neck pain since early September when was seen  here on September 1st  with acute pain  involving  her left shoulder.  Her baby daughter fell asleep in her arms for several hours and patient later noticed shoulder pain.  She and her family were leaving on a beach vacation.  She was given an injection of Depo-Medrol, Xylocaine, and Marcaine in left shoulder on September 1 and prescription for Flexeril 10 mg tablets to take at bedtime.    She reported a similar issue with her first baby in November 2020.  At that time, had decreased range of motion left shoulder and it was difficult for her to raise left arm up over her hand due to pain.  At that time we injected her shoulder with Depo-Medrol,  Marcaine and Xylocaine.  She was given Flexeril to take at bedtime and was advised to use ice or heat for her shoulder.  She had x-ray of left shoulder that was normal and improved.     Says she is getting an cervical epidural steroid injection next month from Dr. Mina Marble. Currently taking meloxicam at night.   Patient wants to see Dr. Grayland Ormond at [facility] for evaluation. Her aunt who works there has suggested him.  Another issue today is that both ears feel stopped up.  No recent URI symptoms.  Children have had air issues recently.  She has history of asthma.  History of migraine headaches.  Is intolerant of certain types of anesthesia.  No known drug allergies.  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  Anterior cruciate ligament surgery in [place] right knee 2004.  Fractured right leg growth plate at age 7.  Family history: Father living with history of diabetes.  Mother living with history of melanoma.  1 brother in good health.  Social history: She is married and has 2 children.  She has a JD degree from [employer].  Employed as a Secondary school teacher at [employer].  Does not smoke.  Social alcohol consumption.  She exercises several days a week.  Review of Systems having some issues recently dropping objects at  times that she thinks is due to paresthesias which is concerning for myelopathy  Also complaining of ears being stopped up     Objective:   Physical Exam Temperature 98.1 degrees pulse 67 regular blood pressure 112/70 weight 176 pounds 8 ounces BMI 29.37  Both TMs are full, pink with splayed light reflex  Neuro exam deferred today.  Dr. Laurena Bering notes reviewed     Assessment & Plan:  Bilateral serous otitis media  Has been diagnosed by Dr. Lynann Bologna with cervical disc disease at C5-C6 and C6-C7.  Also  she is having some issues with impingement/myelopathy as she is dropping objects.  Having neck pain in addition to  left upper extremity pain and now numbness in the left hand.  She is scheduled to see Dr.Wang for epidural steroid injection in cervical area    Patient asked that I place referral to Dr. Grayland Ormond at [facility] for consultation.  For serous otitis media, I have prescribed Zithromax Z-PAK 2 tablets day 1 followed by 1 tab days 2 through 5.

## 2021-05-29 NOTE — Patient Instructions (Addendum)
Take Zithromax Z-PAK 2 tabs day 1 followed by 1 tab days 2 through 5 for serous otitis media.  At patient request, we will place referral to Dr. Grayland Ormond at [facility] and patient will follow through on that referral.

## 2022-06-22 ENCOUNTER — Telehealth: Payer: Self-pay | Admitting: Internal Medicine

## 2022-06-22 NOTE — Telephone Encounter (Signed)
Julie Contreras [phone]  [name] called to say she has been sick since 05/27/2022, she went to the urgent care then and got an antibiotic and steroid, went again on 06/10/2022. When she first went 05/27/22 she had a cough for 4 weeks, when she went on 06/10/22 she had cough for 2 weeks and sinus problems, now she has cough ears and face hurts, runny nose,sneezing. I let her know you may want her to do home COVID test before coming to office. Both time going to Urgent Care they tested her and she was negative for COVID   05/27/2022 Assessment:  1. Cough, unspecified type  2. Acute bacterial bronchitis   Prescribed  promethazine-dextromethorphan (PROMETHAZINE-DM) 6.25-15 MG/5ML syrup  Indications: Cough, unspecified typeTake 5 mLs by mouth 3 (three) times a day as needed for Cough. 120 mL  011/07/2021  predniSONE (DELTASONE) 20 mg tablet  Indications: Cough, unspecified type, Acute bacterial bronchitisTake two tablets PO once daily with food for five days 10 tablet  011/01/202311/05/2022 amoxicillin-clavulanate (AUGMENTIN) 875-125 mg per tablet  Indications: Acute bacterial bronchitisTake one tablet by mouth 2 (two) times daily for 10 days. Take with food 20 tablet  011/01/202311/05/2022  06/09/2022 Assessment: 1. Viral URI 2. Cough, unspecified type benzonatate (TESSALON PERLES) 100 mg capsule 3. Nasal congestion

## 2022-06-22 NOTE — Telephone Encounter (Signed)
scheduled

## 2022-06-23 ENCOUNTER — Ambulatory Visit
Admission: RE | Admit: 2022-06-23 | Discharge: 2022-06-23 | Disposition: A | Discharge status: Home / Self Care | Payer: 59 | Source: Ambulatory Visit | Attending: Internal Medicine | Admitting: Internal Medicine

## 2022-06-23 ENCOUNTER — Ambulatory Visit (INDEPENDENT_AMBULATORY_CARE_PROVIDER_SITE_OTHER): Payer: 59 | Admitting: Internal Medicine

## 2022-06-23 ENCOUNTER — Encounter: Payer: Self-pay | Admitting: Internal Medicine

## 2022-06-23 VITALS — BP 116/76 | HR 81 | Temp 98.2°F

## 2022-06-23 DIAGNOSIS — J069 Acute upper respiratory infection, unspecified: Secondary | ICD-10-CM | POA: Diagnosis not present

## 2022-06-23 DIAGNOSIS — R051 Acute cough: Secondary | ICD-10-CM | POA: Diagnosis not present

## 2022-06-23 DIAGNOSIS — J018 Other acute sinusitis: Secondary | ICD-10-CM

## 2022-06-23 LAB — POCT INFLUENZA A/B
Influenza A, POC: NEGATIVE
Influenza B, POC: NEGATIVE

## 2022-06-23 LAB — POC COVID19 BINAXNOW: SARS Coronavirus 2 Ag: NEGATIVE

## 2022-06-23 MED ORDER — CEFTRIAXONE SODIUM 1 G IJ SOLR
1.0000 g | Freq: Once | INTRAMUSCULAR | Status: AC
  Administered 2022-06-23: 1 g via INTRAMUSCULAR

## 2022-06-23 MED ORDER — DOXYCYCLINE HYCLATE 100 MG PO TABS: 100.0000 mg | ORAL_TABLET | Freq: Two times a day (BID) | ORAL | 0 refills | Status: AC

## 2022-06-23 MED ORDER — METHYLPREDNISOLONE 4 MG PO TBPK: ORAL_TABLET | ORAL | 0 refills | Status: AC

## 2022-06-23 MED ORDER — HYDROCODONE BIT-HOMATROP MBR 5-1.5 MG/5ML PO SOLN: 5.0000 mL | Freq: Three times a day (TID) | ORAL | 0 refills | Status: AC | PRN

## 2022-06-23 MED ORDER — FLUCONAZOLE 150 MG PO TABS: 150.0000 mg | ORAL_TABLET | Freq: Once | ORAL | 0 refills | Status: AC

## 2022-06-23 NOTE — Progress Notes (Signed)
   Subjective:    Patient ID: Julie Contreras, female    DOB: 07-Dec-1985, 36 y.o.   MRN: [id number]  HPI Patient went to [facility] urgent care center on [address] November 1st  and was diagnosed with a respiratory infection and treated with Augmentin 875 mg twice daily for 10 days, prednisone 20 mg 2 tablets once daily for 5 days and Promethazine DM cough syrup 1 teaspoon by mouth 3 times a day as needed for cough.  Patient says symptoms did not improve and she went back on November 15 2 same urgent care center and was given [employer]  Patient complaining of productive cough, upper respiratory congestion, ear discomfort, runny nose and sneezing.  She did a home COVID test which was negative.  She said each time at the urgent care she was tested for COVID and was negative each time.  She did receive flu vaccine October 13.  Has not had recent COVID booster.   Denies high fever, shaking chills, myalgias.  Her general health is excellent.  She has 2 young children.  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  Does not take any chronic medications.        Review of Systems see above-noted to have deep congested cough today     Objective:   Physical Exam Blood pressure 116/76 pulse 81, regular temperature 98.2 degrees pulse oximetry 99% Skin warm and dry.  No cervical adenopathy.  TMs are clear.  Pharynx injected without exudate.  Chest has some scattered rhonchi without wheezing      Assessment & Plan:  Acute lower respiratory infection  Acute pharyngitis-nonstrep  Plan: Respiratory virus panel obtained.  Rapid flu test was negative.  Rapid COVID test was also negative.  Patient has been ill now since early November.  She is tired and wants to be better.  Chest x-ray ordered.  No evidence of pneumonia.  Treated with doxycycline 100 mg twice daily for 10 days, Diflucan 150 mg tablet if needed for Candida vaginitis.  Given a methylprednisolone 4 mg 6-day Dosepak to take in  tapering course as directed.  Hycodan 1 teaspoon every 8 hours as needed for cough.

## 2022-06-23 NOTE — Patient Instructions (Signed)
Chest x-ray is negative.  Take doxycycline 100 mg twice daily for 10 days.  Take Diflucan if needed for Candida vaginitis while on antibiotics.  Take methylprednisolone in tapering course as directed over 6 days.  Hycodan 1 teaspoon every 8 hours as needed for cough.  Rest and stay well-hydrated.

## 2022-06-25 LAB — RESPIRATORY VIRUS PANEL
Adenovirus B: NOT DETECTED
HUMAN PARAINFLU VIRUS 1: NOT DETECTED
HUMAN PARAINFLU VIRUS 2: NOT DETECTED
HUMAN PARAINFLU VIRUS 3: NOT DETECTED
INFLUENZA A SUBTYPE H1: NOT DETECTED
INFLUENZA A SUBTYPE H3: NOT DETECTED
Influenza A: NOT DETECTED
Influenza B: NOT DETECTED
Metapneumovirus: NOT DETECTED
Respiratory Syncytial Virus A: NOT DETECTED
Respiratory Syncytial Virus B: NOT DETECTED
Rhinovirus: DETECTED — AB

## 2023-01-26 DIAGNOSIS — Z3202 Encounter for pregnancy test, result negative: Secondary | ICD-10-CM | POA: Diagnosis not present

## 2023-01-26 DIAGNOSIS — N898 Other specified noninflammatory disorders of vagina: Secondary | ICD-10-CM | POA: Diagnosis not present

## 2023-01-26 DIAGNOSIS — R52 Pain, unspecified: Secondary | ICD-10-CM | POA: Diagnosis not present

## 2023-01-26 DIAGNOSIS — N939 Abnormal uterine and vaginal bleeding, unspecified: Secondary | ICD-10-CM | POA: Diagnosis not present

## 2023-03-01 DIAGNOSIS — N939 Abnormal uterine and vaginal bleeding, unspecified: Secondary | ICD-10-CM | POA: Diagnosis not present

## 2023-07-29 DIAGNOSIS — Z01419 Encounter for gynecological examination (general) (routine) without abnormal findings: Secondary | ICD-10-CM | POA: Diagnosis not present

## 2023-07-29 DIAGNOSIS — R319 Hematuria, unspecified: Secondary | ICD-10-CM | POA: Diagnosis not present

## 2023-07-29 DIAGNOSIS — Z130 Encounter for screening for diseases of the blood and blood-forming organs and certain disorders involving the immune mechanism: Secondary | ICD-10-CM | POA: Diagnosis not present

## 2023-07-29 DIAGNOSIS — Z6829 Body mass index (BMI) 29.0-29.9, adult: Secondary | ICD-10-CM | POA: Diagnosis not present

## 2023-07-29 DIAGNOSIS — Z13228 Encounter for screening for other metabolic disorders: Secondary | ICD-10-CM | POA: Diagnosis not present

## 2023-07-29 DIAGNOSIS — Z131 Encounter for screening for diabetes mellitus: Secondary | ICD-10-CM | POA: Diagnosis not present

## 2023-07-29 DIAGNOSIS — Z1329 Encounter for screening for other suspected endocrine disorder: Secondary | ICD-10-CM | POA: Diagnosis not present

## 2023-07-29 DIAGNOSIS — Z13220 Encounter for screening for lipoid disorders: Secondary | ICD-10-CM | POA: Diagnosis not present

## 2023-10-14 ENCOUNTER — Encounter: Payer: Self-pay | Admitting: Internal Medicine

## 2023-10-14 ENCOUNTER — Ambulatory Visit (INDEPENDENT_AMBULATORY_CARE_PROVIDER_SITE_OTHER): Admitting: Internal Medicine

## 2023-10-14 VITALS — BP 116/80 | HR 69 | Temp 98.2°F | Wt 180.0 lb

## 2023-10-14 DIAGNOSIS — R079 Chest pain, unspecified: Secondary | ICD-10-CM

## 2023-10-14 DIAGNOSIS — R0789 Other chest pain: Secondary | ICD-10-CM | POA: Diagnosis not present

## 2023-10-14 DIAGNOSIS — Z8249 Family history of ischemic heart disease and other diseases of the circulatory system: Secondary | ICD-10-CM | POA: Diagnosis not present

## 2023-10-14 MED ORDER — NAPROXEN 375 MG PO TABS: 375.0000 mg | ORAL_TABLET | Freq: Two times a day (BID) | ORAL | 1 refills | Status: AC

## 2023-10-14 NOTE — Progress Notes (Signed)
 Patient Care Team: Margaree Mackintosh, MD as PCP - General (Internal Medicine)  Visit Date: 10/14/23  Subjective:   Chief Complaint  Patient presents with   Chest Pain    On-going pain and pressure on the left side. Pt notices discomfort more when stressed or dealing with anxiety. Denies any increased heart rate or palpations. Pt has tried Tylenol to relieve discomfort.    Patient Julie Contreras,Female DOB:Jan 16, 1986,37 y.o. NFA:[id number]   38 y.o. Female presents today for acute visit with Chest Pains. Patient has a past medical history of Gestational HTN & DM. She does not take any medications for hypertensive or diabetic management, and her last hypertensive blood pressure was in 12/2017, during her pregnancy, at 145/84, otherwise they have been normotensive post-partum. Says her mother recently was diagnosed with heart disease due to Elevated LDL, but otherwise no fhx of cardiac issues. Blood pressure today normo-tensive at 116/80. She says that theses pains are ongoing, accompanied by a pressure, on the left side, which becomes more noticeable when she is stressed or anxious and she has tried Tylenol for relief. Denies tachycardia, SOB, or palpitations. According to her she has noticed this issues for the past 9 months, but has experienced a similar issue years ago, which she subsequently went to the ED for and was diagnosed with costochondritis. She denies doing extensive weightlifting or stretching. Mentions that she is having the pain currently, while in-office.  Past Medical History:  Diagnosis Date   Gestational diabetes    Headache    PONV (postoperative nausea and vomiting)   No Known Allergies  Family History  Problem Relation Age of Onset   Melanoma Mother    Bladder Cancer Father    Diabetes Maternal Grandmother    Kidney cancer Maternal Grandfather    Bladder Cancer Paternal Grandfather   Father, living, w/ hx of Diabetes.  Mother, living, w/ hx of Melanoma and more  recently diagnosed w/Heart Disease due to Elevated LDL.  1 Brother in good health. Social History   Social History Narrative   Married w/ 2 children. She has a JD degree from [employer]. Employed as a Equities trader at [employer]. Does not smoke. Social alcohol consumption. She exercises several days a week.    Review of Systems  Cardiovascular:  Positive for chest pain (ongoing, left-sided, more noticable when stressed/anxious). Negative for palpitations.       (-) Tachycardia  Psychiatric/Behavioral:  The patient is not nervous/anxious.      Objective:  Vitals: BP 116/80   Pulse 69   Temp 98.2 F (36.8 C)   Wt 180 lb (81.6 kg)   SpO2 98%   BMI 29.95 kg/m   Physical Exam Vitals and nursing note reviewed.  Constitutional:      General: She is not in acute distress.    Appearance: Normal appearance. She is not toxic-appearing.  HENT:     Head: Normocephalic and atraumatic.  Cardiovascular:     Rate and Rhythm: Normal rate and regular rhythm. No extrasystoles are present.    Pulses: Normal pulses.     Heart sounds: Normal heart sounds, S1 normal and S2 normal. No murmur heard.    No friction rub. No gallop.  Pulmonary:     Effort: Pulmonary effort is normal. No respiratory distress.     Breath sounds: Normal breath sounds. No wheezing or rales.  Skin:    General: Skin is warm and dry.  Neurological:     Mental  Status: She is alert and oriented to person, place, and time. Mental status is at baseline.  Psychiatric:        Mood and Affect: Mood normal.        Behavior: Behavior normal.        Thought Content: Thought content normal.        Judgment: Judgment normal.     Results:  Studies Obtained And Personally Reviewed By Me: BP Readings from Last 9 Encounters:  10/14/23 116/80  06/23/22 116/76  05/29/21 112/70  03/27/21 100/80  03/20/21 140/80  04/14/20 121/66  03/03/20 113/69  05/29/19 130/80  07/05/18 120/70   EKG 10/14/2023 Normal  sinus rhythm  No STT wave change  Labs:     Component Value Date/Time   NA 136 04/13/2020 0428   K 3.8 04/13/2020 0428   CL 106 04/13/2020 0428   CO2 22 04/13/2020 0428   GLUCOSE 105 (H) 04/13/2020 0428   BUN 7 04/13/2020 0428   CREATININE 0.52 04/13/2020 0428   CREATININE 0.69 07/17/2014 0915   CALCIUM 8.4 (L) 04/13/2020 0428   PROT 5.4 (L) 04/13/2020 0428   ALBUMIN 2.3 (L) 04/13/2020 0428   AST 17 04/13/2020 0428   ALT 13 04/13/2020 0428   ALKPHOS 119 04/13/2020 0428   BILITOT 0.5 04/13/2020 0428   GFRNONAA >60 04/13/2020 0428   GFRAA >60 04/13/2020 0428    Lab Results  Component Value Date   WBC 14.3 (H) 04/13/2020   HGB 10.6 (L) 04/13/2020   HCT 32.3 (L) 04/13/2020   MCV 90.7 04/13/2020   PLT 217 04/13/2020   Lab Results  Component Value Date   CHOL 144 07/17/2014   HDL 45 07/17/2014   LDLCALC 86 07/17/2014   TRIG 63 07/17/2014   CHOLHDL 3.2 07/17/2014   Lab Results  Component Value Date   HGBA1C 5.5 03/20/2021    Lab Results  Component Value Date   TSH 1.801 07/17/2014    Assessment & Plan:   Orders Placed This Encounter  Procedures   CT CARDIAC SCORING (SELF PAY ONLY)   Chest Pain at Rest: suspect musculoskeletal pain- has been occurring for at least 9 months, more noticeable when stressed/anxious or at rest. Pt says that the last time she had pain similar she was diagnosed with costochondritis. Does not do any weight-lifting or extensive chest exercises. Was having pains in-office today, subsequent EKG was normal. Sending in Naproxen 375 mg - take 1 tablet (375 mg total) by mouth 2 (two) times daily with a meal.  Suggest coronary calcium scoring to assess for heart disease.Order placed to be done at [facility]. Pt to call for appt.  Fhx of Heart Disease: In light of recent diagnosis in her mother due to elevated LDLs and her symptoms it would be beneficial for her to have a CT Coronary Calcium Scoring. Ordered.   I,Emily Lagle,acting as a  Neurosurgeon for Margaree Mackintosh, MD.,have documented all relevant documentation on the behalf of Margaree Mackintosh, MD,as directed by  Margaree Mackintosh, MD while in the presence of Margaree Mackintosh, MD.   I, Margaree Mackintosh, MD, have reviewed all documentation for this visit. The documentation on 10/14/23 for the exam, diagnosis, procedures, and orders are all accurate and complete.

## 2023-10-14 NOTE — Patient Instructions (Addendum)
 Suspect you are having musculoskeletal chest wall pain.  Take Naprosyn 375 mg twice daily with a meal as needed.  Suggest coronary calcium scoring to assess for heart disease which will also include a CT of your lungs.  Order has been placed and you may call for an appointment at any time.  We are sorry you are not feeling well.  As always, it was a pleasure to see you today.

## 2023-10-18 ENCOUNTER — Encounter: Payer: Self-pay | Admitting: Internal Medicine

## 2023-10-20 ENCOUNTER — Ambulatory Visit (HOSPITAL_COMMUNITY)
Admission: RE | Admit: 2023-10-20 | Discharge: 2023-10-20 | Disposition: A | Discharge status: Home / Self Care | Payer: Self-pay | Source: Ambulatory Visit | Attending: Internal Medicine | Admitting: Internal Medicine

## 2023-10-20 DIAGNOSIS — R079 Chest pain, unspecified: Secondary | ICD-10-CM | POA: Insufficient documentation

## 2024-02-16 DIAGNOSIS — R7309 Other abnormal glucose: Secondary | ICD-10-CM | POA: Diagnosis not present

## 2024-04-28 ENCOUNTER — Ambulatory Visit: Payer: Self-pay

## 2024-04-28 DIAGNOSIS — Z23 Encounter for immunization: Secondary | ICD-10-CM

## 2024-07-17 DIAGNOSIS — D2262 Melanocytic nevi of left upper limb, including shoulder: Secondary | ICD-10-CM | POA: Diagnosis not present

## 2024-07-17 DIAGNOSIS — D2261 Melanocytic nevi of right upper limb, including shoulder: Secondary | ICD-10-CM | POA: Diagnosis not present

## 2024-07-17 DIAGNOSIS — D225 Melanocytic nevi of trunk: Secondary | ICD-10-CM | POA: Diagnosis not present

## 2024-07-24 ENCOUNTER — Ambulatory Visit: Payer: Self-pay

## 2024-07-24 ENCOUNTER — Telehealth: Admitting: Internal Medicine

## 2024-07-24 VITALS — Ht 65.0 in | Wt 180.0 lb

## 2024-07-24 DIAGNOSIS — J101 Influenza due to other identified influenza virus with other respiratory manifestations: Secondary | ICD-10-CM

## 2024-07-24 MED ORDER — BENZONATATE 100 MG PO CAPS: 100.0000 mg | ORAL_CAPSULE | Freq: Three times a day (TID) | ORAL | 0 refills | Status: AC | PRN

## 2024-07-24 MED ORDER — OSELTAMIVIR PHOSPHATE 75 MG PO CAPS: 75.0000 mg | ORAL_CAPSULE | Freq: Two times a day (BID) | ORAL | 0 refills | Status: AC

## 2024-07-24 NOTE — Telephone Encounter (Signed)
 Please book her a video visit at 4:45pm.

## 2024-07-24 NOTE — Telephone Encounter (Signed)
 Can we do a video visit at 4:45pm?

## 2024-07-24 NOTE — Telephone Encounter (Signed)
 I called her to let her know about the video visit and she did not pick up.

## 2024-07-24 NOTE — Telephone Encounter (Signed)
 FYI Only or Action Required?: Action required by provider: clinical question for provider, update on patient condition, and asking for tamiflu .  Patient was last seen in primary care on 10/14/2023 by Perri Ronal PARAS, MD.  Called Nurse Triage reporting Influenza.  Symptoms began today.  Interventions attempted: Rest, hydration, or home remedies.  Symptoms are: unchanged.  Triage Disposition: Call PCP Within 24 Hours  Patient/caregiver understands and will follow disposition?: Yes  Copied from CRM #[id number]. Topic: Clinical - Red Word Triage >> Jul 24, 2024  3:51 PM Larissa S wrote: [facility] that prompted transfer to Nurse Triage:  body aches, cough, sore throat. Reason for Disposition  [1] Patient is NOT HIGH RISK AND [2] strongly requests antiviral medicine AND [3] flu symptoms present < 48 hours  Answer Assessment - Initial Assessment Questions Reports son tested positive for flu B yesterday. Patient reports cough, sore throat, body aches and drainage. Patient is asking if tamiflu  could be sent in for her with her son positive for flu.  1. SYMPTOMS: What is your main symptom or concern? (e.g., cough, fever, shortness of breath, muscle aches)     Cough 2. ONSET: When did the symptoms start?      Started today 3. COUGH: Do you have a cough? If Yes, ask: How bad is the cough?       Yes-moderate 4. FEVER: Do you have a fever? If Yes, ask: What is your temperature, how was it measured, and when did it start?     no 5. BREATHING DIFFICULTY: Are you having any difficulty breathing? (e.g., normal; shortness of breath, wheezing, unable to speak)      no 6. BETTER-SAME-WORSE: Are you getting better, staying the same or getting worse compared to yesterday?  If getting worse, ask, In what way?     Symptoms started today 7. OTHER SYMPTOMS: Do you have any other symptoms?  (e.g., chills, fatigue, headache, loss of smell or taste, muscle pain, sore throat)     Body aches,  sore throat 8. INFLUENZA EXPOSURE: Was there any known exposure to influenza (flu) before the symptoms began?      Son tested positive yesterday. Patient has been sleeping with her son for four days 9. INFLUENZA SUSPECTED: Why do you think you have influenza? (e.g., positive flu self-test at home, symptoms after exposure).     Symptoms after exposure-sone tested positive for flu B 10. INFLUENZA VACCINE: Have you had the flu vaccine? If Yes, ask: When did you last get it?       yes 11. HIGH RISK FOR COMPLICATIONS: Do you have any chronic medical problems? (e.g., asthma, heart or lung disease, obesity, weak immune system)       asthma 12. PREGNANCY: Is there any chance you are pregnant? When was your last menstrual period?       no  Protocols used: Influenza (Flu) Suspected-A-AH

## 2024-07-24 NOTE — Progress Notes (Signed)
 "   Patient Care Team: Perri Ronal PARAS, MD as PCP - General (Internal Medicine)  I connected with Rocky Allyn Leaven on 07/24/2024 at 5:05 PM by video enabled telemedicine visit and verified that I am speaking with the correct person using two identifiers.   I discussed the limitations, risks, security and privacy concerns of performing an evaluation and management service by telemedicine and the availability of in-person appointments. I also discussed with the patient that there may be a patient responsible charge related to this service. The patient expressed understanding and agreed to proceed.   Other persons participating in the visit and their role in the encounter: Medical scribe, Nestora JAYSON Bathe  Patients location: Office Providers location: Clinic   I provided 10 minutes of face-to-face video visit time during this encounter, and > 50% was spent counseling as documented under my assessment & plan. I also spent time with chart review, medical decision making and e-scribing medication.  Chief Complaint: Cough   Subjective:    Patient ID: Julie Contreras , Female    DOB: [birth year], 38 y.o.    MRN: [id number]   38 y.o. Female presents today for cough. Patient has a past medical history of Gestational hypertension, Gestational diabetes.   She has been experiencing congestion, cough, and headache. Her son recently went to the doctor and tested positive for Influenza.   Vaccine counseling: UTD for influenza vaccine.   Past Medical History:  Diagnosis Date   Gestational diabetes    Headache    PONV (postoperative nausea and vomiting)      Family History  Problem Relation Age of Onset   Melanoma Mother    Bladder Cancer Father    Diabetes Maternal Grandmother    Kidney cancer Maternal Grandfather    Bladder Cancer Paternal Grandfather     Social History   Social History Narrative   Married w/ 2 children. She has a JD degree from [employer]. Employed as a Horticulturist, commercial at [employer]. Does not smoke. Social alcohol consumption. She exercises several days a week.       Review of Systems  Constitutional:  Negative for chills and fever.  HENT:  Positive for congestion.   Respiratory:  Positive for cough. Negative for sputum production.   Neurological:  Positive for headaches.        Objective:   Vitals: Ht 5' 5 (1.651 m)   Wt 180 lb (81.6 kg)   BMI 29.95 kg/m    Physical Exam Vitals and nursing note reviewed.       Results:     Labs:       Component Value Date/Time   NA 136 04/13/2020 0428   K 3.8 04/13/2020 0428   CL 106 04/13/2020 0428   CO2 22 04/13/2020 0428   GLUCOSE 105 (H) 04/13/2020 0428   BUN 7 04/13/2020 0428   CREATININE 0.52 04/13/2020 0428   CREATININE 0.69 07/17/2014 0915   CALCIUM 8.4 (L) 04/13/2020 0428   PROT 5.4 (L) 04/13/2020 0428   ALBUMIN 2.3 (L) 04/13/2020 0428   AST 17 04/13/2020 0428   ALT 13 04/13/2020 0428   ALKPHOS 119 04/13/2020 0428   BILITOT 0.5 04/13/2020 0428   GFRNONAA >60 04/13/2020 0428   GFRAA >60 04/13/2020 0428     Lab Results  Component Value Date   WBC 14.3 (H) 04/13/2020   HGB 10.6 (L) 04/13/2020   HCT 32.3 (L) 04/13/2020   MCV 90.7 04/13/2020   PLT  217 04/13/2020    Lab Results  Component Value Date   CHOL 144 07/17/2014   HDL 45 07/17/2014   LDLCALC 86 07/17/2014   TRIG 63 07/17/2014   CHOLHDL 3.2 07/17/2014    Lab Results  Component Value Date   HGBA1C 5.5 03/20/2021     Lab Results  Component Value Date   TSH 1.801 07/17/2014          Assessment & Plan:    Meds ordered this encounter  Medications   benzonatate  (TESSALON ) 100 MG capsule    Sig: Take 1 capsule (100 mg total) by mouth 3 (three) times daily as needed for cough.    Dispense:  30 capsule    Refill:  0   oseltamivir  (TAMIFLU ) 75 MG capsule    Sig: Take 1 capsule (75 mg total) by mouth 2 (two) times daily.    Dispense:  10 capsule    Refill:  0    Influenza A: She  has been experiencing congestion, cough, and headaches. Her son recently went to the doctor and tested positive for Influenza. Patient received Influenza vaccine earlier this season. Plan: Benzonatate  100 mg three times daily as needed for cough prescribed    Tamiflu  75 mg twice daily  x 5 days prescribed.             Quarantine at home until afebrile for at least 24 hours and feeling better. Rest  and stay well hydrated. Tylenol  as needed for fever and myalgias.   Time spent interviewing patient, chart review , medical decision making, and e-scribing medication to pharmacy is 20 minutes.   I,Makayla C Reid,acting as a scribe for Ronal JINNY Hailstone, MD.,have documented all relevant documentation on the behalf of Ronal JINNY Hailstone, MD,as directed by  Ronal JINNY Hailstone, MD while in the presence of Ronal JINNY Hailstone, MD.  I, Ronal JINNY Hailstone, MD, have reviewed all documentation for this visit. The documentation on 07/24/2024 for the exam, diagnosis, procedures, and orders are all accurate and complete.   "

## 2024-07-25 ENCOUNTER — Encounter: Payer: Self-pay | Admitting: Internal Medicine

## 2024-07-25 NOTE — Patient Instructions (Signed)
 We are sorry you are not feeling well today.  Based on history you have provided us , we believe you have  Influenza A.  Tamiflu  75 mg twice daily by mouth for 5 days has been prescribed along with Tessalon  Perles 100 mg 3 times daily as needed for cough.  Recommend quarantining at home for 48 to 72 hours.  Rest and stay well-hydrated.  Call if you have persistent fever or develop protracted cough.

## 2024-08-01 ENCOUNTER — Other Ambulatory Visit: Payer: Self-pay | Admitting: Obstetrics & Gynecology

## 2024-08-01 DIAGNOSIS — N644 Mastodynia: Secondary | ICD-10-CM

## 2024-08-15 ENCOUNTER — Ambulatory Visit
Admission: RE | Admit: 2024-08-15 | Discharge: 2024-08-15 | Disposition: A | Source: Ambulatory Visit | Attending: Obstetrics & Gynecology | Admitting: Obstetrics & Gynecology

## 2024-08-15 DIAGNOSIS — N644 Mastodynia: Secondary | ICD-10-CM
